# Patient Record
Sex: Male | Born: 1975 | Race: Black or African American | Hispanic: No | Marital: Single | Smoking: Never smoker
Health system: Southern US, Community
[De-identification: ages and names within clinical notes are randomized; demographics above are authoritative.]

## PROBLEM LIST (undated history)

## (undated) DIAGNOSIS — F419 Anxiety disorder, unspecified: Secondary | ICD-10-CM

## (undated) DIAGNOSIS — G4730 Sleep apnea, unspecified: Secondary | ICD-10-CM

## (undated) DIAGNOSIS — R410 Disorientation, unspecified: Secondary | ICD-10-CM

## (undated) DIAGNOSIS — F329 Major depressive disorder, single episode, unspecified: Secondary | ICD-10-CM

## (undated) DIAGNOSIS — R6250 Unspecified lack of expected normal physiological development in childhood: Secondary | ICD-10-CM

## (undated) DIAGNOSIS — H539 Unspecified visual disturbance: Secondary | ICD-10-CM

## (undated) DIAGNOSIS — E669 Obesity, unspecified: Secondary | ICD-10-CM

## (undated) DIAGNOSIS — F32A Depression, unspecified: Secondary | ICD-10-CM

## (undated) DIAGNOSIS — R569 Unspecified convulsions: Secondary | ICD-10-CM

## (undated) DIAGNOSIS — F79 Unspecified intellectual disabilities: Secondary | ICD-10-CM

## (undated) DIAGNOSIS — W19XXXA Unspecified fall, initial encounter: Secondary | ICD-10-CM

## (undated) DIAGNOSIS — G808 Other cerebral palsy: Secondary | ICD-10-CM

## (undated) DIAGNOSIS — R296 Repeated falls: Secondary | ICD-10-CM

## (undated) DIAGNOSIS — G40909 Epilepsy, unspecified, not intractable, without status epilepticus: Secondary | ICD-10-CM

## (undated) HISTORY — DX: Depression, unspecified: F32.A

## (undated) HISTORY — DX: Sleep apnea, unspecified: G47.30

## (undated) HISTORY — DX: Disorientation, unspecified: R41.0

## (undated) HISTORY — PX: COLONOSCOPY: SHX174

## (undated) HISTORY — DX: Unspecified lack of expected normal physiological development in childhood: R62.50

## (undated) HISTORY — DX: Anxiety disorder, unspecified: F41.9

## (undated) HISTORY — DX: Unspecified intellectual disabilities: F79

## (undated) HISTORY — DX: Other cerebral palsy: G80.8

## (undated) HISTORY — DX: Unspecified visual disturbance: H53.9

## (undated) HISTORY — DX: Obesity, unspecified: E66.9

## (undated) HISTORY — DX: Repeated falls: R29.6

## (undated) HISTORY — DX: Epilepsy, unspecified, not intractable, without status epilepticus: G40.909

## (undated) HISTORY — DX: Major depressive disorder, single episode, unspecified: F32.9

## (undated) HISTORY — DX: Unspecified convulsions: R56.9

## (undated) HISTORY — DX: Unspecified fall, initial encounter: W19.XXXA

---

## 2000-09-13 ENCOUNTER — Emergency Department (HOSPITAL_COMMUNITY): Admission: EM | Admit: 2000-09-13 | Discharge: 2000-09-13 | Payer: Self-pay | Admitting: Emergency Medicine

## 2000-09-13 ENCOUNTER — Encounter: Payer: Self-pay | Admitting: Emergency Medicine

## 2004-02-20 ENCOUNTER — Ambulatory Visit (HOSPITAL_COMMUNITY): Admission: RE | Admit: 2004-02-20 | Discharge: 2004-02-20 | Payer: Self-pay | Admitting: Dentistry

## 2005-02-11 ENCOUNTER — Encounter: Admission: RE | Admit: 2005-02-11 | Discharge: 2005-02-11 | Payer: Self-pay | Admitting: Internal Medicine

## 2005-02-17 ENCOUNTER — Ambulatory Visit (HOSPITAL_BASED_OUTPATIENT_CLINIC_OR_DEPARTMENT_OTHER): Admission: RE | Admit: 2005-02-17 | Discharge: 2005-02-17 | Payer: Self-pay | Admitting: Internal Medicine

## 2005-02-23 ENCOUNTER — Ambulatory Visit: Payer: Self-pay | Admitting: Internal Medicine

## 2005-07-15 ENCOUNTER — Ambulatory Visit: Payer: Self-pay | Admitting: Family Medicine

## 2005-08-02 ENCOUNTER — Emergency Department (HOSPITAL_COMMUNITY): Admission: EM | Admit: 2005-08-02 | Discharge: 2005-08-02 | Payer: Self-pay | Admitting: Emergency Medicine

## 2005-11-18 ENCOUNTER — Emergency Department (HOSPITAL_COMMUNITY): Admission: EM | Admit: 2005-11-18 | Discharge: 2005-11-18 | Payer: Self-pay | Admitting: Emergency Medicine

## 2007-12-21 ENCOUNTER — Ambulatory Visit: Payer: Self-pay | Admitting: Gastroenterology

## 2008-01-19 ENCOUNTER — Ambulatory Visit: Payer: Self-pay | Admitting: Gastroenterology

## 2008-01-19 DIAGNOSIS — K625 Hemorrhage of anus and rectum: Secondary | ICD-10-CM

## 2008-01-19 DIAGNOSIS — K5900 Constipation, unspecified: Secondary | ICD-10-CM | POA: Insufficient documentation

## 2008-01-20 ENCOUNTER — Telehealth: Payer: Self-pay | Admitting: Gastroenterology

## 2008-02-01 ENCOUNTER — Telehealth: Payer: Self-pay | Admitting: Gastroenterology

## 2008-03-06 ENCOUNTER — Ambulatory Visit: Payer: Self-pay | Admitting: Gastroenterology

## 2008-03-08 ENCOUNTER — Telehealth: Payer: Self-pay | Admitting: Gastroenterology

## 2008-03-10 ENCOUNTER — Telehealth (INDEPENDENT_AMBULATORY_CARE_PROVIDER_SITE_OTHER): Payer: Self-pay | Admitting: *Deleted

## 2008-03-13 ENCOUNTER — Ambulatory Visit: Payer: Self-pay | Admitting: Gastroenterology

## 2010-05-15 ENCOUNTER — Emergency Department (HOSPITAL_BASED_OUTPATIENT_CLINIC_OR_DEPARTMENT_OTHER)
Admission: EM | Admit: 2010-05-15 | Discharge: 2010-05-15 | Disposition: A | Discharge status: Home / Self Care | Payer: Medicare Other | Attending: Emergency Medicine | Admitting: Emergency Medicine

## 2010-05-15 ENCOUNTER — Emergency Department (INDEPENDENT_AMBULATORY_CARE_PROVIDER_SITE_OTHER): Payer: Medicare Other

## 2010-05-15 DIAGNOSIS — X58XXXA Exposure to other specified factors, initial encounter: Secondary | ICD-10-CM | POA: Insufficient documentation

## 2010-05-15 DIAGNOSIS — G809 Cerebral palsy, unspecified: Secondary | ICD-10-CM | POA: Insufficient documentation

## 2010-05-15 DIAGNOSIS — M79609 Pain in unspecified limb: Secondary | ICD-10-CM

## 2010-05-15 DIAGNOSIS — G4730 Sleep apnea, unspecified: Secondary | ICD-10-CM | POA: Insufficient documentation

## 2010-05-15 DIAGNOSIS — I10 Essential (primary) hypertension: Secondary | ICD-10-CM | POA: Insufficient documentation

## 2010-05-15 DIAGNOSIS — Z79899 Other long term (current) drug therapy: Secondary | ICD-10-CM | POA: Insufficient documentation

## 2010-08-23 NOTE — Op Note (Signed)
Mario Willis, Mario Willis              ACCOUNT NO.:  [id number]   MEDICAL RECORD NO.:  [id number]          PATIENT TYPE:  OIB   LOCATION:  2899                         FACILITY:  [facility]   PHYSICIAN:  Griffith Citron Mohorn, D.D.S.DATE OF BIRTH:  10-Jun-1975   DATE OF PROCEDURE:  02/20/2004  DATE OF DISCHARGE:                                 OPERATIVE REPORT   PREOPERATIVE DIAGNOSES:  1.  Impacted tooth #17.  2.  Nonfunctional and decayed teeth #1 and 16.   POSTOPERATIVE DIAGNOSES:  1.  Impacted tooth #17.  2.  Nonfunctional and decayed teeth #1 and 16.  3.  Excessive gingival tissue overlying tooth #15.   PROCEDURE:  1.  Extraction of partially impacted tooth #17.  2.  Surgical extraction of teeth #1 and 16.  3.  Gingivectomy adjacent to tooth #15.   SURGEON:  Griffith Citron Mohorn, D.D.S.   ANESTHESIA:  General anesthesia.   INDICATION FOR PROCEDURE:  The patient was evaluated in my office following  a referral from his dentist for removal of multiple third molars.  Following  clinical examination, he was found to have a partially impacted tooth #17  and nonfunctional teeth #1 and 16.  Due to the patient's history of seizures  and possible inability to cooperative preoperatively, it was elected to have  the procedure performed in the main operating room at [facility].   DESCRIPTION OF PROCEDURE:  The patient was brought to the operating room and  placed in a supine position.  Once general anesthesia was induced via  nasotracheal intubation, the patient was prepped and draped for a  maxillofacial procedure of this type.  Initially 7 mL of 2% lidocaine with  1:100,000 epinephrine was injected into the posterior left and right maxilla  as well as the left posterior mandible.  A preoperative exam revealed  excessive gingival tissue overlying tooth #15; therefore, it was elected at  that time to perform a gingivectomy.  Initially attention was directed  toward the extraction of tooth #17.   A 15 blade was utilized to create an  incision extending from the buccal mucosa to the distal facial of tooth #17.  An anterior releasing incision was created and a mucoperiosteal flap was  reflected, exposing the bone overlying a portion of the crown of tooth #17.  A rotary osteotome with copious amounts of saline irrigation was utilized to  remove the overlying bone and section tooth #17.  The tooth was sectioned  successfully and all parts were removed without complications.  The bone was  smoothed and the site was irrigated with normal saline, and the soft tissue  was reapproximated with 3-0 chromic suture.   Attention was then directed toward areas #16 and 15.  A 15 blade was  utilized to create a vertical releasing incision anterior to tooth #15 with  the incision continuing posteriorly around 15 and 16.  A full-thickness  mucoperiosteal flap was reflected.  The bone on the buccal surface of tooth  #15 and 16 was found to be excessively thick.  Rongeurs and a Fisher bur  with copious amounts of saline  irrigation were utilized to remove the  excessive bone.  The bone was removed down to the root on tooth #16.  This  tooth was mobilized and eventually removed without complications.  Next,  prior to reapproximation of the flap the excessive gingival tissue adjacent  to tooth #15 was then excised with the 15 blade.  It was scalloped to match  the adjacent gingival tissue on tooth #14.  At this point all the bone was  smoothed and the site was irrigated thoroughly with normal saline and the  soft tissue was reapproximated with 0 chromic suture.   Attention was then directed toward the removal of tooth #1.  Again, a  vertical releasing incision was made anterior to tooth #2 with the incision  continuing posteriorly around 2 and tooth #1.  A full-thickness  mucoperiosteal flap was reflected and a Fisher bur was utilized to remove  the bone buccal to the roots of tooth #1.  The bone was  removed down to  where the roots were completely exposed on the buccal surface.  The tooth  was then mobilized.  The gingival tissue was also loosened from the palatal  surface.  The tooth was then removed.  During removal of this tooth, a 1 cm  tear in the palatal gingival tissue was noted.  The bone was smoothed and  the area and the site was irrigated with normal saline.  The palatal  gingival tear was repaired with 3-0 chromic suture and the remaining  gingival tissue was sutured close utilizing 3-0 chromic suture.  The oral  cavity was thoroughly irrigated and suctioned dry and examined for any bone  or tooth fragments.  The oropharyngeal throat pack was then removed and the  patient was allowed to awaken and transferred to the PACU in stable and  satisfactory condition.       DJM/MEDQ  D:  02/20/2004  T:  02/20/2004  Job:  [id number]

## 2010-08-23 NOTE — Procedures (Signed)
NAMEAMBROSIO, Mario Willis              ACCOUNT NO.:  [id number]   MEDICAL RECORD NO.:  [id number]          PATIENT TYPE:  OUT   LOCATION:  SLEEP CENTER                 FACILITY:  [facility]   PHYSICIAN:  Clinton D. Maple Hudson, M.D. DATE OF BIRTH:  1975-05-03   DATE OF STUDY:  02/17/2005                              NOCTURNAL POLYSOMNOGRAM   REFERRING PHYSICIAN:  Dr. Della Goo.   DATE OF STUDY:  February 17, 2005.   INDICATION FOR STUDY:  Insomnia with sleep apnea.   EPWORTH SLEEPINESS SCORE:  5/24.   BMI:  36.   WEIGHT:  273 pounds.   Complaints of restless screaming and kicking in sleep and enuresis.   SLEEP ARCHITECTURE:  Total sleep time 441 minutes with sleep efficiency 99%.  Stage I was absent, stage II 82%, stages III and IV 11%, REM 7% of total  sleep time. Sleep latency 2 minutes, REM latency 375 minutes, awake after  sleep onset 1 minute, arousal index increased at 44. Bedtime medications  were not reported.   RESPIRATORY DATA:  Split study protocol. Apnea/hypopnea index (AHI, RDI)  86.6 obstructive events per hour indicating very severe obstructive sleep  apnea/hypopnea syndrome before C-PAP. This included 198 obstructive apneas  before C-PAP. Most sleep and therefore most events were while supine. REM  AHI of 3.9. C-PAP was titrated to 25 CWP. AHI at this pressure was 1.2 per  hour. A bilevel machine would be required for this pressure. Significant  obstructive events were not reported higher than 23 CWP with the higher  pressure provided final snoring control. A large full-face ultra mirage mask  was used with heated humidifier and it was reported he tolerated C-PAP well.   OXYGEN DATA:  Very loud snoring with oxygen desaturation to a nadir of 70%  before C-PAP. After C-PAP control, saturation held 94-98% on room air.   CARDIAC DATA:  Sinus bradycardia and normal sinus rhythm, heart rate 57-68  per minute.   MOVEMENT/PARASOMNIA:  Anuresis. Occasional leg jerk. No  other significant  behavioral disturbance was reported the study night.   IMPRESSION/RECOMMENDATIONS:  1.  Severe obstructive sleep apnea / hypopnea syndrome, AHI 86.6 per hour      with very loud snoring and oxygen desaturation to 70%.  2.  Successful C-PAP titration to a relatively high pressure of 25 CWP. This      would require a bilevel machine. A large full-face ultra mirage mask was      used with heated humidifier and reportedly well tolerated.  3.  Enuresis.      Clinton D. Maple Hudson, M.D.  Diplomate, Biomedical engineer of Sleep Medicine  Electronically Signed     CDY/MEDQ  D:  02/23/2005 10:27:56  T:  02/23/2005 22:58:42  Job:  [id number]

## 2011-12-12 IMAGING — CR DG HAND COMPLETE 3+V*R*
3 series · 3 of 3 positions shown · non-contrast
Comparison: None

CLINICAL DATA: Fell.  Right hand pain.

RIGHT HAND - COMPLETE 3+ VIEW

[x hand pa right]
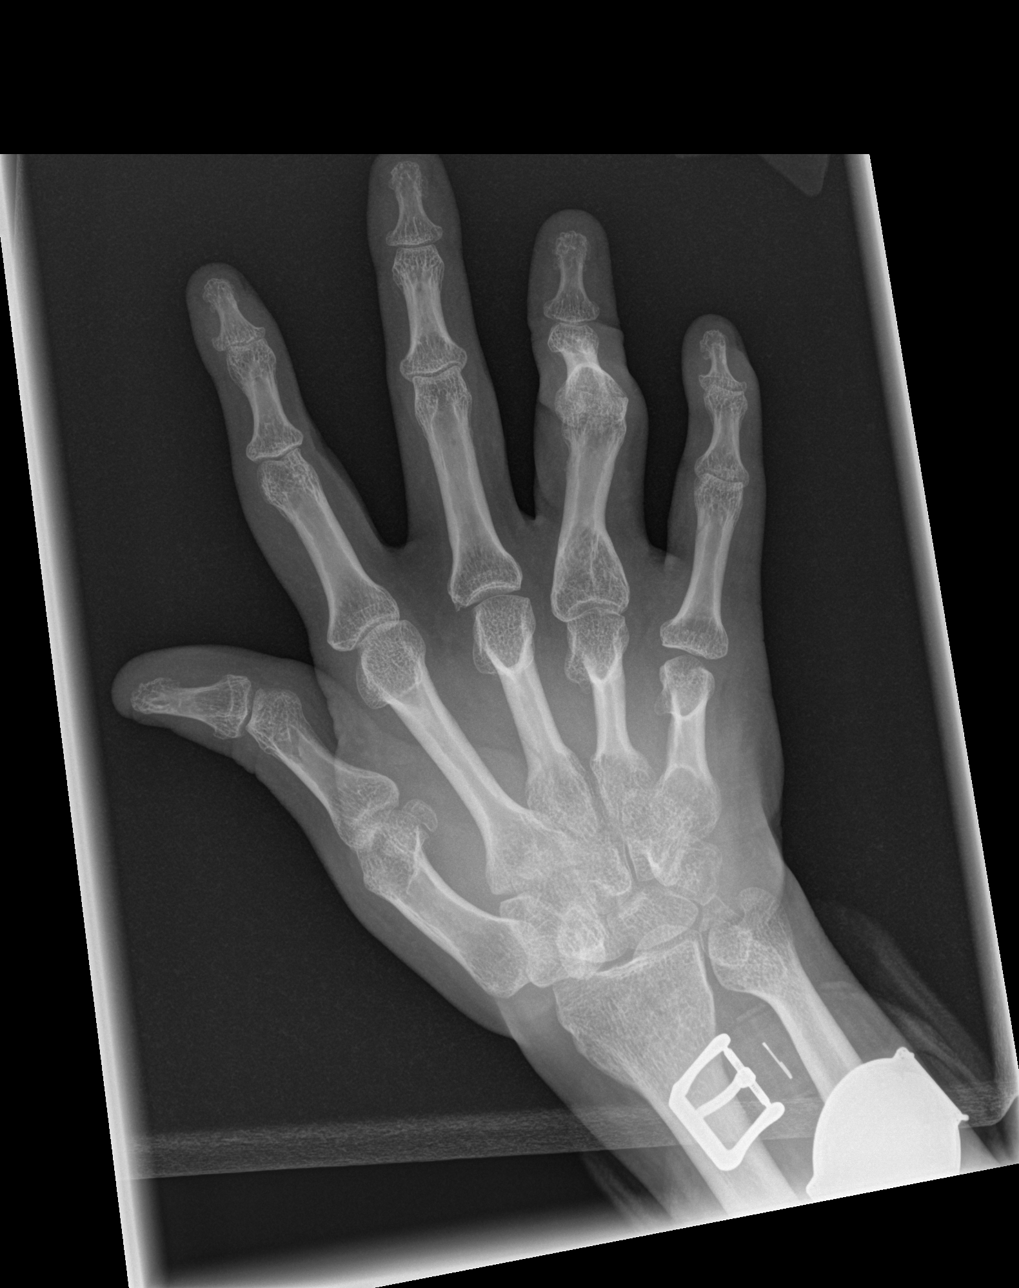

[x hand oblique right]
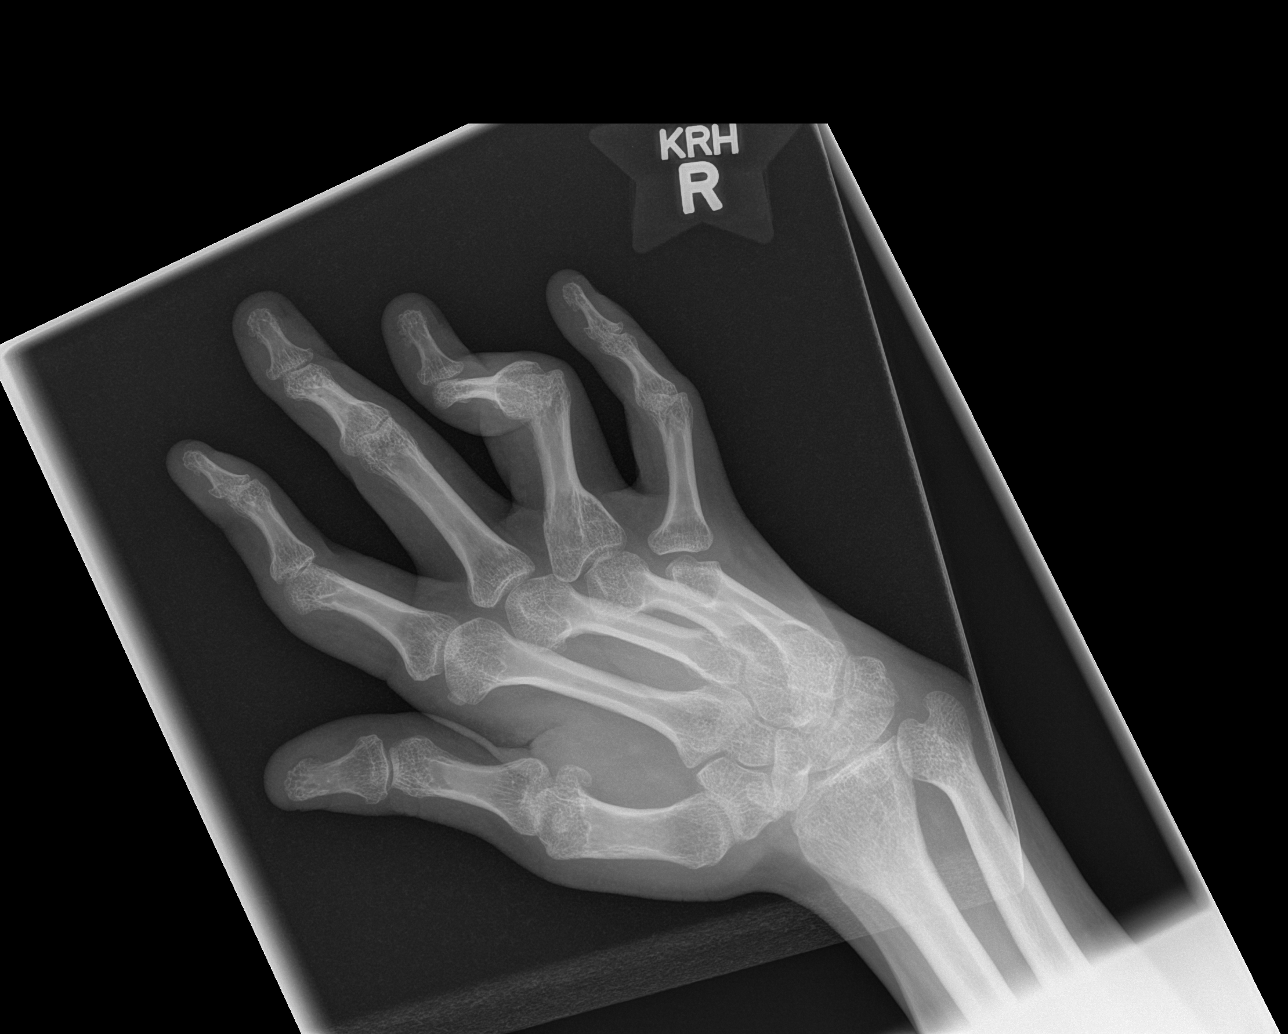

[x hand lat right]
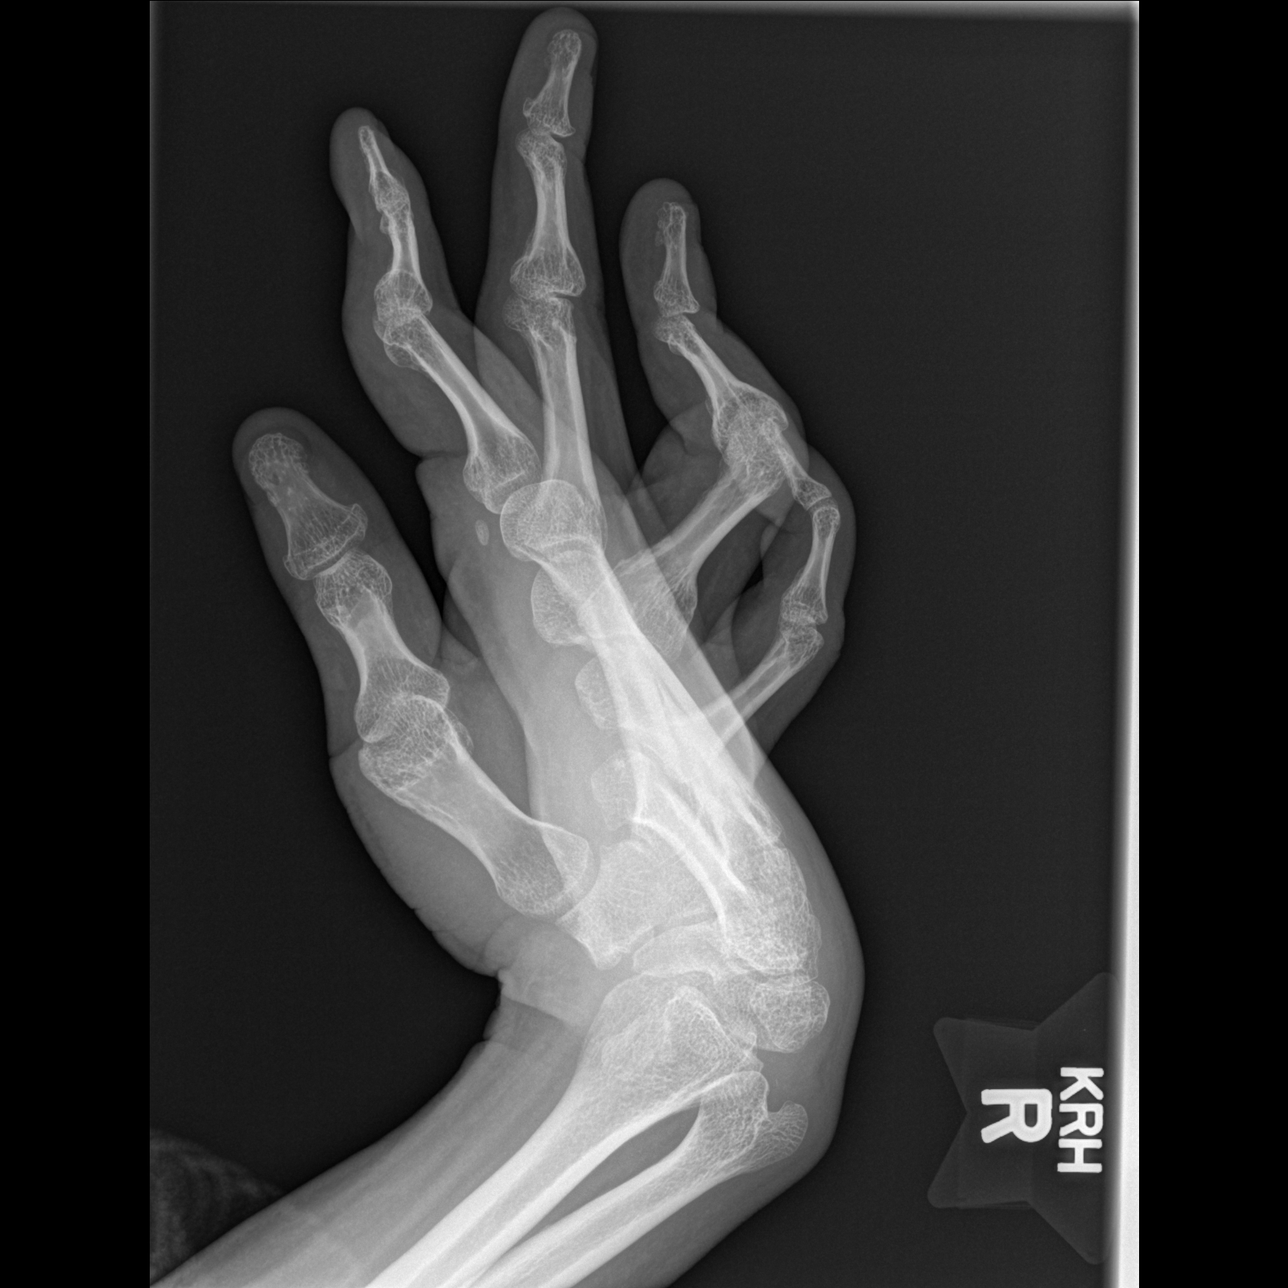

[3 of 3 positions shown; findings below may reference images not displayed]

FINDINGS: Moderate deformity of the hand with mild degenerative
changes.  No definite acute fracture.
IMPRESSION: No acute bony findings.

## 2012-07-22 ENCOUNTER — Telehealth: Payer: Self-pay

## 2012-07-22 NOTE — Telephone Encounter (Signed)
Unknown Male called and left a message saying the Lyrica 75mg  needs authorization.  Records show the patient has refills on file at the pharmacy.  A prior Berkley Harvey is needed through ins.  I will initiate PA.  Will have to wait for ins response.

## 2012-11-23 ENCOUNTER — Ambulatory Visit: Payer: Self-pay | Admitting: Neurology

## 2012-12-10 ENCOUNTER — Other Ambulatory Visit: Payer: Self-pay | Admitting: Neurology

## 2012-12-10 NOTE — Telephone Encounter (Signed)
Rx signed and faxed.

## 2012-12-17 ENCOUNTER — Ambulatory Visit: Payer: Self-pay | Admitting: Neurology

## 2013-02-01 ENCOUNTER — Encounter: Payer: Self-pay | Admitting: Neurology

## 2013-02-01 ENCOUNTER — Ambulatory Visit (INDEPENDENT_AMBULATORY_CARE_PROVIDER_SITE_OTHER): Payer: Medicare Other | Admitting: Neurology

## 2013-02-01 VITALS — BP 121/69 | HR 71 | Resp 18 | Ht 75.0 in | Wt 220.0 lb

## 2013-02-01 DIAGNOSIS — G4733 Obstructive sleep apnea (adult) (pediatric): Secondary | ICD-10-CM

## 2013-02-01 DIAGNOSIS — G4730 Sleep apnea, unspecified: Secondary | ICD-10-CM

## 2013-02-01 DIAGNOSIS — R6250 Unspecified lack of expected normal physiological development in childhood: Secondary | ICD-10-CM

## 2013-02-01 DIAGNOSIS — G808 Other cerebral palsy: Secondary | ICD-10-CM

## 2013-02-01 DIAGNOSIS — N3944 Nocturnal enuresis: Secondary | ICD-10-CM

## 2013-02-01 DIAGNOSIS — G40909 Epilepsy, unspecified, not intractable, without status epilepticus: Secondary | ICD-10-CM

## 2013-02-01 DIAGNOSIS — G40802 Other epilepsy, not intractable, without status epilepticus: Secondary | ICD-10-CM

## 2013-02-01 HISTORY — DX: Sleep apnea, unspecified: G47.30

## 2013-02-01 HISTORY — DX: Other cerebral palsy: G80.8

## 2013-02-01 HISTORY — DX: Unspecified lack of expected normal physiological development in childhood: R62.50

## 2013-02-01 HISTORY — DX: Epilepsy, unspecified, not intractable, without status epilepticus: G40.909

## 2013-02-01 MED ORDER — DILTIAZEM HCL ER 240 MG PO CP24: 240.0000 mg | ORAL_CAPSULE | Freq: Every day | ORAL | Status: AC

## 2013-02-01 NOTE — Patient Instructions (Signed)
Seizures You had a seizure. About 2% of the population will have a seizure problem during their lifetime. Sometimes the cause for the seizure is not known. Seizures are usually associated with one of these problems:  Epilepsy.   Not taking your seizure medicine.   Alcohol and drug abuse.   Head injury, strokes, tumors, and brain surgery.   High fever and infections.   Low blood sugar.  Evaluating a new seizure disorder may require having a brain scan or a brain wave test called an EEG. If you have been given a seizure medicine, it is very important that you take it as prescribed. Not taking these medicines as directed is the most common cause of seizures. Blood tests are often used to be sure you are taking the proper dose.  Seizures cause many different symptoms, from convulsions to brief blackouts. Do not ride a bike, drive a car, go swimming, climb in high or dangerous places such as ladders or roofs, or operate any dangerous equipment until you have your doctor's permission. If you hold a driver's license, state law may require that a report be made to the motor vehicles department. You should wear an emergency medical identification bracelet with information about your seizures. If you have any warning that a seizure may occur, lie down in a safe place to protect yourself. Teach your family and friends what to do if you have any further seizures. They should stay calm and try to keep you from falling on hard or sharp objects. It is best not to try to restrain a seizing person or to force anything into his or her mouth. Do not try to open clenched jaws. When the seizure is over, the person should be rolled on their side to help drain any vomit or secretions from the mouth. After a seizure, a person may be confused or drowsy for several minutes. An ambulance should be called if the seizure lasted more than 5 minutes or if confusion remains for more than 30 minutes. Call your caregiver or the  emergency department for further instructions. Do not drive until cleared by your caregiver or neurologist! Document Released: 05/01/2004 Document Revised: 12/04/2010 Document Reviewed: 03/24/2005 Exit[employer] Patient Information 2012 [employer]. 

## 2013-02-01 NOTE — Progress Notes (Signed)
[facility]  Provider:  Melvyn Novas, M D  Referring Provider: No ref. provider found Primary Care Physician:  Mayra Neer, MD  Chief Complaint  Patient presents with  . CPAP F/U Visit   Seizure disorder, MRDD and Sleep apnea.      HPI:  Mario Willis is a 37 y.o. male  Is seen here as a referral/ revisit  from Dr. Lovell Sheehan.   Is a long-time established patient in our practice or as a child followed Dr. them thickening for seizure disorder. 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 He has right-sided spastic hemiparesis he has seizures arising from the left hemisphere and has a history of mental retardation or delay. No originally started to see Davon Abdelaziz. date he was morbidly obese. In 2006 is the study was performed revealing an AHI of 86.6 at [facility] he was split to CPAP and that at 25 cm water with a fullface mask. There were no adjustments made over the uterus and he had not seen anybody for asleep and medicine evaluation. In 2012 his CPAP was program and his caretaker mentions as he also had lost 80 pounds and it was well-liked likely that he didn't need the same high pressures anymore. He was re\re titrated and doing the sleep study with EEG showed left hemispheric epileptiform discharges.  He was titrated to 10 cm water with 2 cm EPR. He had no active seizures history since at least 2011, and his sleepiness score measured by Epworth questionnaire had been soaking of 21 and his last visit dated 11-12-11. Today he brought his machine with him and the download shows that the patient has a residual AHI of 4.4 has been using the machine for 60 over 60 days. His average time in therapy at night is 10 hours and 43 minutes. The machine is a VPAP bottle showing a medium inspiratory pressure of 10.7 cm and  expiratory pressure of 6.7 cm water.  He does have high air leak however. His download on 11-17-2011 the last available  prior to today showed an AHI of 5.8 average daily time and therapy of 9 hours 55 minutes.   similar IPAP and EPAP pressures. Based  on these data,  there is no change necessary  Rylin also endorsed the Epworth Sleepiness Scale at 7 points only the lowest I have seen him doing and her fatigue severity scale at 33 points.       Review of Systems: Out of a complete 14 system review, the patient complains of only the following symptoms, and all other reviewed systems are negative.  Epworth  7 points.   History   Social History  . Marital Status: Single    Spouse Name: N/A    Number of Children: N/A  . Years of Education: N/A   Occupational History  . Not on file.   Social History Main Topics  . Smoking status: Never Smoker   . Smokeless tobacco: Never Used  . Alcohol Use: No  . Drug Use: No  . Sexual Activity: No   Other Topics Concern  . Not on file   Social History Narrative  . No narrative on file    Family History  Problem Relation Age of Onset  . Family history unknown: Yes    Past Medical History  Diagnosis Date  . Sleep apnea   . Mental disability     childhood  . Depression   . Anxiety   .  Obesity   . Seizures   . Developmental delay   . Vision abnormalities     Past Surgical History  Procedure Laterality Date  . Colonoscopy      Current Outpatient Prescriptions  Medication Sig Dispense Refill  . clonazePAM (KLONOPIN) 1 MG tablet Take 1 mg by mouth 3 (three) times daily as needed for anxiety.      Marland Kitchen desmopressin (DDAVP) 0.2 MG tablet Take 0.2 mg by mouth daily. At night for insipidus.      . divalproex (DEPAKOTE) 500 MG DR tablet Take 500 mg by mouth 3 (three) times daily. Three at night po      . LYRICA 75 MG capsule TAKE ONE CAPSULE BY MOUTH AT BEDTIME  90 capsule  0  . QUEtiapine (SEROQUEL) 300 MG tablet Take 300 mg by mouth at bedtime. 2 at 8 Pm po.       No current facility-administered medications for this visit.    Allergies as of  02/01/2013 - Review Complete 02/01/2013  Allergen Reaction Noted  . Gabapentin      Vitals: BP 121/69  Pulse 71  Resp 18  Ht 6\' 3"  (1.905 m)  Wt 220 lb (99.791 kg)  BMI 27.5 kg/m2 Last Weight:  Wt Readings from Last 1 Encounters:  02/01/13 220 lb (99.791 kg)   Last Height:   Ht Readings from Last 1 Encounters:  02/01/13 6\' 3"  (1.905 m)    Physical exam:  General: The patient is awake, alert and appears not in acute distress. The patient is well groomed. Head: Normocephalic, atraumatic. Neck is supple. Mallampati 3, neck circumference: 16 inches  , facial  Deformity based on hemiparesis of right body and left  face.  Cardiovascular:  Regular rate and rhythm, without  murmurs or carotid bruit, and without distended neck veins. Respiratory: Lungs are clear to auscultation. Skin:  Without evidence of edema, or rash Trunk: BMI is elevated .  Neurologic exam : The patient is awake and alert, oriented to place and time.  Limited attention span & concentration ability.  Speech is non-fluent without  dysarthria, dysphonia or aphasia. Mood and affect are appropriate.  Cranial nerves: Pupils are equal and briskly reactive to light.  Extraocular movements  in vertical and horizontal planes intact and with nystagmus 4-5 beats to the right.  . Visual fields by finger perimetry are intact on the right , the left seems to have peripheral field loss.  Hearing to finger rub intact.   Facial sensation intact to fine touch.  Facial motor strength is asymmetric , tongue and uvula  Midline .    Motor exam:  Hemiparesis with spasticity in the right body.   Sensory:  Fine touch, pinprick and vibration were tested in all extremities  Proprioception is tested in the upper extremities only. This was intact. Gait is impaired by hemiparesis. No recent falls, he has a foot drop on the right. Babinski on the right positive, DTR elevated on the right.     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 Behavior difficulties controlled on seroquel.Klonopin and  Lyrica.  2) seizures , controlled.  3) OSA controlled on auto PAP IPAP and EPAP are variable. continue use - without settings. Marland Kitchen

## 2013-02-04 ENCOUNTER — Encounter: Payer: Self-pay | Admitting: Neurology

## 2013-02-25 ENCOUNTER — Other Ambulatory Visit: Payer: Self-pay | Admitting: Neurology

## 2013-02-25 NOTE — Telephone Encounter (Signed)
Patient's prescription was faxed over to [facility] at [phone].

## 2013-08-29 ENCOUNTER — Other Ambulatory Visit: Payer: Self-pay | Admitting: Neurology

## 2013-08-30 ENCOUNTER — Other Ambulatory Visit: Payer: Self-pay

## 2013-08-30 MED ORDER — PREGABALIN 75 MG PO CAPS: ORAL_CAPSULE | ORAL | Status: DC

## 2013-08-31 NOTE — Telephone Encounter (Signed)
Rx has been faxed.

## 2014-01-31 ENCOUNTER — Encounter: Payer: Self-pay | Admitting: Neurology

## 2014-01-31 ENCOUNTER — Ambulatory Visit (INDEPENDENT_AMBULATORY_CARE_PROVIDER_SITE_OTHER): Payer: Medicare Other | Admitting: Neurology

## 2014-01-31 VITALS — BP 138/82 | HR 75 | Temp 98.7°F | Resp 12 | Ht 73.0 in | Wt 219.0 lb

## 2014-01-31 DIAGNOSIS — Z9989 Dependence on other enabling machines and devices: Secondary | ICD-10-CM

## 2014-01-31 DIAGNOSIS — G40909 Epilepsy, unspecified, not intractable, without status epilepticus: Secondary | ICD-10-CM

## 2014-01-31 DIAGNOSIS — G801 Spastic diplegic cerebral palsy: Secondary | ICD-10-CM

## 2014-01-31 DIAGNOSIS — G4733 Obstructive sleep apnea (adult) (pediatric): Secondary | ICD-10-CM

## 2014-01-31 DIAGNOSIS — F89 Unspecified disorder of psychological development: Secondary | ICD-10-CM

## 2014-01-31 MED ORDER — PREGABALIN 75 MG PO CAPS: ORAL_CAPSULE | ORAL | Status: DC

## 2014-01-31 NOTE — Progress Notes (Signed)
[facility]  Provider:  Melvyn Novasarmen  Janene Yousuf, M D  Referring Provider: Mayra NeerJenkins, Sharrah, MD Primary Care Physician:  Mayra NeerJENKINS,SHARRAH, MD  Chief Complaint  Patient presents with  . RV sleep    Rm 10, care provider   Seizure disorder, MRDD and Sleep apnea.      HPI:  Mario Willis is a 38 y.o. male  Is seen here as a referral/ revisit  from Dr. Lovell SheehanJenkins.   Is a long-time established patient in our practice or as a child followed Dr. Sharene SkeansHickling for seizure disorder.  Mario Willis is by default left-handed as  cerebral palsy affected his right side.  He has right-sided spastic hemiparesis he has seizures arising from the left hemisphere and has a history of mental retardation.  When I originally started to see Mario Willis,  he was morbidly obese.   In 2006 is the study was performed revealing an AHI of 86.6 at [facility] , where he was split to CPAP and that at 25 cm water with a fullface mask.  There were no adjustments made over the uterus and he had not seen anybody for asleep and medicine evaluation.   In 2012 his CPAP was programmed ,he also had lost 80 pounds and it was  likely that he didn't need the same high pressures anymore.  He was re\re titrated and during the sleep study his  EEG showed left hemispheric epileptiform discharges. He was titrated to 10 cm water with 2 cm EPR. He had no active seizures history since at least 2011, and his sleepiness score measured by Epworth questionnaire had been soaking of 21 and his last visit dated 11-12-11. Today he brought his machine with him and the download shows that the patient has a residual AHI of 4.4 has been using the machine for 60 over 60 days. His average time in therapy at night is 10 hours and 43 minutes. The machine is a VPAP bottle showing a medium inspiratory pressure of 10.7 cm and  expiratory pressure of 6.7 cm water.  He does have high air leak however. His download on 11-17-2011 the last available prior to  today showed an AHI of 5.8 average daily time and therapy of 9 hours 55 minutes.  similar IPAP and EPAP pressures. Based  on these data,  there is no change necessary  01-31-14;  The patient apears unchanged to last years visit , and his caretaker filled his Epworth score at 16 points. He has some confusion about date and day of the week. This seems new to his caretaker.  He is scheduled [facility] a treatment of the contracted ankles and wrist of the right hand.  He needs refills for seizure medication , Lyrica at 75 mg daily,  and a compliance download  For this yearly Rv is due today. CPAP today, 88% compliance over the last 90 days.  AHI is 6.3, higher than desired.  His user time is  8 hours and 39 minutes. Very high air leak. New mask needed.      Review of Systems: Out of a complete 14 system review, the patient complains of only the following symptoms, and all other reviewed systems are negative.  Epworth  16 points.  Seizures, last over 12 month ago.   History   Social History  . Marital Status: Single    Spouse Name: N/A    Number of Children: N/A  . Years of Education: N/A   Occupational  History  . Not on file.   Social History Main Topics  . Smoking status: Never Smoker   . Smokeless tobacco: Never Used  . Alcohol Use: No  . Drug Use: No  . Sexual Activity: No   Other Topics Concern  . Not on file   Social History Narrative   Left handed, NO caffeine, [employer].  Works at a shop (skills).  (non pay).     History reviewed. No pertinent family history.  Past Medical History  Diagnosis Date  . Sleep apnea   . Mental disability     childhood  . Depression   . Anxiety   . Obesity   . Seizures   . Developmental delay   . Vision abnormalities   . Development delay 02/01/2013  . Cerebral palsy, hemiplegic 02/01/2013  . Sleep apnea with use of continuous positive airway pressure (CPAP) 02/01/2013    Severe apnea  with asv , IPAP and EPAP  Not set, auto function.   Marland Kitchen. Epileptic seizures 02/01/2013  . Falls     increased falls since 7-8 mo ago  . Confusion     since 7-8 mo ago.    Past Surgical History  Procedure Laterality Date  . Colonoscopy      Current Outpatient Prescriptions  Medication Sig Dispense Refill  . clonazePAM (KLONOPIN) 1 MG tablet Take 1 mg by mouth 3 (three) times daily as needed for anxiety.      Marland Kitchen. desmopressin (DDAVP) 0.2 MG tablet Take 0.2 mg by mouth daily. At night for insipidus.      Marland Kitchen. diltiazem (DILACOR XR) 240 MG 24 hr capsule Take 1 capsule (240 mg total) by mouth daily.  30 capsule  11  . divalproex (DEPAKOTE) 500 MG DR tablet Take 500 mg by mouth 3 (three) times daily. Three at night po      . docusate sodium (STOOL SOFTENER) 100 MG capsule 60 mg 2 (two) times daily. 1 pill 2 x a day      . FLUoxetine (PROZAC) 20 MG capsule Take 20 mg by mouth at bedtime.       . montelukast (SINGULAIR) 10 MG tablet Take 10 mg by mouth every morning. 1 pill in the am      . polyethylene glycol (MIRALAX / GLYCOLAX) packet Take 17 g by mouth daily as needed.      . pregabalin (LYRICA) 75 MG capsule TAKE ONE CAPSULE BY MOUTH AT BEDTIME  90 capsule  1  . QUEtiapine (SEROQUEL) 300 MG tablet Take 300 mg by mouth at bedtime. 2 at 8 Pm po.       No current facility-administered medications for this visit.    Allergies as of 01/31/2014 - Review Complete 01/31/2014  Allergen Reaction Noted  . Gabapentin      Vitals: BP 138/82  Pulse 75  Temp(Src) 98.7 F (37.1 C) (Oral)  Resp 12  Ht 6\' 1"  (1.854 m)  Wt 219 lb (99.338 kg)  BMI 28.90 kg/m2 Last Weight:  Wt Readings from Last 1 Encounters:  01/31/14 219 lb (99.338 kg)   Last Height:   Ht Readings from Last 1 Encounters:  01/31/14 6\' 1"  (1.854 m)    Physical exam:  General: The patient is awake, alert and appears not in acute distress. The patient is well groomed. Head: Normocephalic, atraumatic. Neck is supple. Mallampati  3, neck circumference: 16 inches  , facial  Deformity based on hemiparesis of right body and left  face.  Cardiovascular:  Regular rate and rhythm, without  murmurs or carotid bruit, and without distended neck veins. Respiratory: Lungs are clear to auscultation. Skin:  Without evidence of edema, or rash Trunk: BMI is elevated .  Neurologic exam : The patient is awake and alert, oriented to place and time.  Limited attention span & concentration ability.  Speech is non-fluent without  dysarthria, dysphonia or aphasia. Mood and affect are appropriate.  Cranial nerves: Pupils are equal and briskly reactive to light.  Extraocular movements  in vertical and horizontal planes intact and with nystagmus 4-5 beats to the right.  . Visual fields by finger perimetry are intact on the right , the left seems to have peripheral field loss.  Hearing to finger rub intact.   Facial sensation intact to fine touch.  Facial motor strength is asymmetric , tongue and uvula  Midline .    Motor exam:  Hemiparesis with spasticity in the right body.   Sensory:  Fine touch, pinprick and vibration were tested in all extremities  Proprioception is tested in the upper extremities only. This was intact.   Gait is impaired by hemiparesis.  No recent falls, he has a foot drop on the right and inverted his foot, contracture?.  Babinski on the right up going,  DTR brisk with spasticity on the right.     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  Behavior difficulties controlled on seroquel. Remains on Klonopin and Lyrica.  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  2) Seizures , controlled. Refilled Lyrica 75 mg daily.  3) OSA controlled on CPAP variable from 5 though 20 cm water . continue use - without settings. Covers FFM.  Still EDS.

## 2014-09-01 ENCOUNTER — Encounter: Payer: Self-pay | Admitting: Gastroenterology

## 2014-09-12 ENCOUNTER — Other Ambulatory Visit: Payer: Self-pay | Admitting: Neurology

## 2014-09-13 NOTE — Telephone Encounter (Signed)
Rx has been signed and faxed  

## 2015-01-01 ENCOUNTER — Ambulatory Visit: Payer: Medicare Other | Admitting: Neurology

## 2015-02-01 ENCOUNTER — Ambulatory Visit (INDEPENDENT_AMBULATORY_CARE_PROVIDER_SITE_OTHER): Payer: Medicare Other | Admitting: Adult Health

## 2015-02-01 ENCOUNTER — Encounter: Payer: Self-pay | Admitting: Adult Health

## 2015-02-01 VITALS — BP 125/78 | HR 82 | Ht 73.0 in | Wt 228.0 lb

## 2015-02-01 DIAGNOSIS — G4733 Obstructive sleep apnea (adult) (pediatric): Secondary | ICD-10-CM | POA: Diagnosis not present

## 2015-02-01 DIAGNOSIS — Z9989 Dependence on other enabling machines and devices: Principal | ICD-10-CM

## 2015-02-01 DIAGNOSIS — G40909 Epilepsy, unspecified, not intractable, without status epilepticus: Secondary | ICD-10-CM

## 2015-02-01 DIAGNOSIS — G801 Spastic diplegic cerebral palsy: Secondary | ICD-10-CM

## 2015-02-01 MED ORDER — PREGABALIN 75 MG PO CAPS: 75.0000 mg | ORAL_CAPSULE | Freq: Every day | ORAL | Status: DC

## 2015-02-01 NOTE — Progress Notes (Signed)
PATIENT: Mario Willis DOB: 12-09-1975  REASON FOR VISIT: follow up- obstructive sleep apnea on CPAP, seizures, cerebral palsy HISTORY FROM: patient  HISTORY OF PRESENT ILLNESS: Mr. Mario Willis is a 39 year old male with a history of obstructive sleep apnea on CPAP, seizures and cerebral palsy. He returns today for follow-up. The patient CPAP download indicates that he uses his machine 89 out of 90 days for compliance of 93%. On average he uses his machine 10 hours and 4 minutes. He uses machine greater than 4 hours 82 out of 90 days for compliance of 91%. His maximum inspiratory pressure is 20 cm of water and minimum expiratory pressure is 5 cm water with pressure support of 4 cm of water. The patient's residual AHI is 7. The patient does have a significant leak at 75.7 L/m in the 95th percentile. The patient's caregiver reports that typically during the night the mask will slide off his face. He also states that occasionally the patient will get up to use the restroom and will not put the mask back on. The patient's Epworth sleepiness score is 14 and fatigue severity score is 57.The patients seizures have been well controlled on Lyrica 75 milligrams daily. He denies having any seizure events in the last year. The patient was recently switched from Depakote to Tegretol. The patient was on this medication for his behavior. He had to be taken off of Depakote due to increased ammonia levels per the caregiver. He returns today for an evaluation.  HISTORY (Dohmeier): Mario AllanMichael E Mikkelson is a 39 y.o. male Is seen here as a referral/ revisit from Dr. Lovell SheehanJenkins.  Is a long-time established patient in our practice or as a child followed Dr. Sharene SkeansHickling for seizure disorder. Mario Willis is by default left-handed as cerebral palsy affected his right side. He has right-sided spastic hemiparesis he has seizures arising from the left hemisphere and has a history of mental retardation. When I originally started to see  Mario Willis, he was morbidly obese.  In 2006 is the study was performed revealing an AHI of 86.6 at [facility] , where he was split to CPAP and that at 25 cm water with a fullface mask. There were no adjustments made over the uterus and he had not seen anybody for asleep and medicine evaluation.   In 2012 his CPAP was programmed ,he also had lost 80 pounds and it was likely that he didn't need the same high pressures anymore.  He was re\re titrated and during the sleep study his EEG showed left hemispheric epileptiform discharges. He was titrated to 10 cm water with 2 cm EPR. He had no active seizures history since at least 2011, and his sleepiness score measured by Epworth questionnaire had been soaking of 21 and his last visit dated 11-12-11. Today he brought his machine with him and the download shows that the patient has a residual AHI of 4.4 has been using the machine for 60 over 60 days. His average time in therapy at night is 10 hours and 43 minutes. The machine is a VPAP bottle showing a medium inspiratory pressure of 10.7 cm and expiratory pressure of 6.7 cm water.  He does have high air leak however. His download on 11-17-2011 the last available prior to today showed an AHI of 5.8 average daily time and therapy of 9 hours 55 minutes. similar IPAP and EPAP pressures. Based on these data, there is no change necessary  01-31-14;  The patient apears unchanged to last years visit ,  and his caretaker filled his Epworth score at 16 points. He has some confusion about date and day of the week. This seems new to his caretaker.  He is scheduled to see Dr Jacqulyn [facility] at [facility] for a treatment of the contracted ankles and wrist of the right hand.  He needs refills for seizure medication , Lyrica at 75 mg daily,  and a compliance download For this yearly Rv is due today. CPAP today, 88% compliance over the last 90 days.  AHI is 6.3, higher than desired.  His user time is 8 hours  and 39 minutes. Very high air leak. New mask needed.     REVIEW OF SYSTEMS: Out of a complete 14 system review of symptoms, the patient complains only of the following symptoms, and all other reviewed systems are negative.  Agitation, behavior problem, confusion, anxious  ALLERGIES: Allergies  Allergen Reactions  . Gabapentin     HOME MEDICATIONS: Outpatient Prescriptions Prior to Visit  Medication Sig Dispense Refill  . clonazePAM (KLONOPIN) 1 MG tablet Take 1 mg by mouth 3 (three) times daily as needed for anxiety.    Marland Kitchen desmopressin (DDAVP) 0.2 MG tablet Take 0.2 mg by mouth daily. At night for insipidus.    Marland Kitchen diltiazem (DILACOR XR) 240 MG 24 hr capsule Take 1 capsule (240 mg total) by mouth daily. 30 capsule 11  . divalproex (DEPAKOTE) 500 MG DR tablet Take 500 mg by mouth 3 (three) times daily. Three at night po    . docusate sodium (STOOL SOFTENER) 100 MG capsule 60 mg 2 (two) times daily. 1 pill 2 x a day    . FLUoxetine (PROZAC) 20 MG capsule Take 20 mg by mouth at bedtime.     Marland Kitchen LYRICA 75 MG capsule TAKE ONE CAPSULE BY MOUTH AT BEDTIME 90 capsule 1  . montelukast (SINGULAIR) 10 MG tablet Take 10 mg by mouth every morning. 1 pill in the am    . polyethylene glycol (MIRALAX / GLYCOLAX) packet Take 17 g by mouth daily as needed.    Marland Kitchen QUEtiapine (SEROQUEL) 300 MG tablet Take 300 mg by mouth at bedtime. 2 at 8 Pm po.     No facility-administered medications prior to visit.    PAST MEDICAL HISTORY: Past Medical History  Diagnosis Date  . Sleep apnea   . Mental disability     childhood  . Depression   . Anxiety   . Obesity   . Seizures   . Developmental delay   . Vision abnormalities   . Development delay 02/01/2013  . Cerebral palsy, hemiplegic 02/01/2013  . Sleep apnea with use of continuous positive airway pressure (CPAP) 02/01/2013    Severe apnea with asv , IPAP and EPAP  Not set, auto function.   Marland Kitchen Epileptic seizures 02/01/2013  . Falls     increased falls  since 7-8 mo ago  . Confusion     since 7-8 mo ago.    PAST SURGICAL HISTORY: Past Surgical History  Procedure Laterality Date  . Colonoscopy      FAMILY HISTORY: No family history on file.  SOCIAL HISTORY: Social History   Social History  . Marital Status: Single    Spouse Name: N/A  . Number of Children: N/A  . Years of Education: N/A   Occupational History  . Not on file.   Social History Main Topics  . Smoking status: Never Smoker   . Smokeless tobacco: Never Used  . Alcohol Use: No  .  Drug Use: No  . Sexual Activity: No   Other Topics Concern  . Not on file   Social History Narrative   Left handed, NO caffeine, [employer].  Works at a shop (skills).  (non pay).       PHYSICAL EXAM  Filed Vitals:   02/01/15 0927  BP: 125/78  Pulse: 82  Height:  (1.854 m)  Weight: 228 lb (103.42 kg)   Body mass index is 30.09 kg/(m^2).  Generalized: Well developed, in no acute distress   Neurological examination  Mentation: Alert. Follows all commands speech and language limited. Cranial nerve II-XII: Pupils were equal round reactive to light. Extraocular movements were full, visual field were full on confrontational test. Facial sensation and strength were normal. Uvula tongue midline. Head turning and shoulder shrug  were normal and symmetric. Motor: The motor testing reveals 5 over 5 strength of all 4 extremities. Good symmetric motor tone is noted throughout.  Contracted right wrist and ankle.  Sensory: Sensory testing is intact to soft touch on all 4 extremities. No evidence of extinction is noted.  Coordination: Cerebellar testing reveals good finger-nose-finger- difficulty on the right due to contracture and good heel-to-shin bilaterally.  Gait and station: diplegic gait. Often dragging the right foot. Tandem gait not attempted. Reflexes: Deep tendon reflexes are symmetric and normal bilaterally.   DIAGNOSTIC DATA (LABS, IMAGING,  TESTING) - I reviewed patient records, labs, notes, testing and imaging myself where available.     ASSESSMENT AND PLAN 39 y.o. year old male  has a past medical history of Sleep apnea; Mental disability; Depression; Anxiety; Obesity; Seizures; Developmental delay; Vision abnormalities; Development delay (02/01/2013); Cerebral palsy, hemiplegic (02/01/2013); Sleep apnea with use of continuous positive airway pressure (CPAP) (02/01/2013); Epileptic seizures (02/01/2013); Falls; and Confusion. here with:  1. Obstructive sleep apnea on CPAP 2. Seizures 3. Cervical palsy  Patient's CPAP compliance is excellent. However his AHI is slightly elevated and the patient does have a significant leak. I have encouraged the patient and his caregiver to tighten his straps on the mask. They verbalized understanding. The patient's seizures have been controlled on Lyrica. I will refill this today. Patient and caregiver advised that if he has any seizure events they should let us know. The patient will follow-up in one year or sooner if needed.   Butch Penny, MSN, NP-C 02/01/2015, 9:26 AM [facility] [address], [address] [place], [place] [address] [phone]

## 2015-02-01 NOTE — Progress Notes (Signed)
I agree with the assessment and plan as directed by NP .The patient is known to me .   Saloni Lablanc, MD  

## 2015-02-01 NOTE — Patient Instructions (Signed)
Continue using the CPAP nightly- tighten straps to avoid leak Continue Lyrica 75 mg daily If you have any seizure events please let us know.  If your symptoms worsen or you develop new symptoms please let us know.

## 2015-08-10 ENCOUNTER — Other Ambulatory Visit: Payer: Self-pay

## 2015-08-10 NOTE — Telephone Encounter (Signed)
Received faxed request (from [facility]) for Lyrica refill. Last OV Oct 2016 w/ 6 mos refills at tha time. Follow-up scheduled Oct 2017.

## 2015-08-13 MED ORDER — PREGABALIN 75 MG PO CAPS: 75.0000 mg | ORAL_CAPSULE | Freq: Every day | ORAL | Status: DC

## 2015-08-13 NOTE — Telephone Encounter (Signed)
Faxed to [phone] confirmation received.  MM/NP.  Just noted message below.

## 2016-02-01 ENCOUNTER — Other Ambulatory Visit: Payer: Self-pay

## 2016-02-01 MED ORDER — PREGABALIN 75 MG PO CAPS: 75.0000 mg | ORAL_CAPSULE | Freq: Every day | ORAL | 0 refills | Status: DC

## 2016-02-01 NOTE — Telephone Encounter (Signed)
30 day rx printed, awaiting signature. Pt has f/u appt next week but is currently out of medication.

## 2016-02-04 ENCOUNTER — Ambulatory Visit: Payer: Medicare Other | Admitting: Adult Health

## 2016-02-04 NOTE — Telephone Encounter (Signed)
Rx printed, signed, faxed to pharmacy. 

## 2016-02-05 ENCOUNTER — Encounter: Payer: Self-pay | Admitting: Adult Health

## 2016-03-04 ENCOUNTER — Ambulatory Visit: Payer: Medicare Other | Admitting: Adult Health

## 2016-03-13 ENCOUNTER — Ambulatory Visit (INDEPENDENT_AMBULATORY_CARE_PROVIDER_SITE_OTHER): Payer: Medicare Other | Admitting: Adult Health

## 2016-03-13 ENCOUNTER — Encounter: Payer: Self-pay | Admitting: Adult Health

## 2016-03-13 VITALS — BP 150/90 | HR 78 | Resp 18 | Ht 73.0 in | Wt 220.0 lb

## 2016-03-13 DIAGNOSIS — R569 Unspecified convulsions: Secondary | ICD-10-CM

## 2016-03-13 DIAGNOSIS — G4733 Obstructive sleep apnea (adult) (pediatric): Secondary | ICD-10-CM | POA: Diagnosis not present

## 2016-03-13 DIAGNOSIS — Z9989 Dependence on other enabling machines and devices: Secondary | ICD-10-CM

## 2016-03-13 MED ORDER — PREGABALIN 75 MG PO CAPS: 75.0000 mg | ORAL_CAPSULE | Freq: Every day | ORAL | 5 refills | Status: DC

## 2016-03-13 NOTE — Patient Instructions (Signed)
CPAP compliance looks good.  Continue Lyrica 75 mg at bedtime If your symptoms worsen or you develop new symptoms please let us know.

## 2016-03-13 NOTE — Progress Notes (Signed)
PATIENT: Mario Willis DOB: 09/18/1975  REASON FOR VISIT: follow up- obstructive sleep apnea on CPAP, seizures HISTORY FROM: patient  HISTORY OF PRESENT ILLNESS: Mario Willis is a [age] year old male with a history of obstructive sleep apnea on CPAP, seizures and cerebral palsy. He returns today for follow-up. His CPAP download indicates that he uses machine 28 out of 30 days for compliance of 93%. Each night he uses his machine 11 hours and 9 minutes. His residual AHI is 4.5 on inspiratory pressure of 20 cm of water and expiratory pressure of 5 cm water with pressure support or 4 cm of water. The patient does have a significant leak in the 95th percentile at 62.3 L/m. Caregiver reports that he tends to get up during the night to use the bathroom and has trouble putting his mask on by himself. He states this is why he has a high leak. The patient continues on Lyrica for seizures. Denies any seizure events. Overall he is doing well. Returns today for an evaluation.  HISTORY 02/01/15: Mario Willis is a 40 year old male with a history of obstructive sleep apnea on CPAP, seizures and cerebral palsy. He returns today for follow-up. The patient CPAP download indicates that he uses his machine 89 out of 90 days for compliance of 93%. On average he uses his machine 10 hours and 4 minutes. He uses machine greater than 4 hours 82 out of 90 days for compliance of 91%. His maximum inspiratory pressure is 20 cm of water and minimum expiratory pressure is 5 cm water with pressure support of 4 cm of water. The patient's residual AHI is 7. The patient does have a significant leak at 75.7 L/m in the 95th percentile. The patient's caregiver reports that typically during the night the mask will slide off his face. He also states that occasionally the patient will get up to use the restroom and will not put the mask back on. The patient's Epworth sleepiness score is 14 and fatigue severity score is 57.The patients seizures  have been well controlled on Lyrica 75 milligrams daily. He denies having any seizure events in the last year. The patient was recently switched from Depakote to Tegretol. The patient was on this medication for his behavior. He had to be taken off of Depakote due to increased ammonia levels per the caregiver. He returns today for an evaluation.  HISTORY (Dohmeier): Mario AllanMichael E Ladley is a 40 y.o. male Is seen here as a referral/ revisit from Dr. Lovell SheehanJenkins.  Is a long-time established patient in our practice or as a child followed Dr. Sharene SkeansHickling for seizure disorder. Mario Willis is by default left-handed as cerebral palsy affected his right side. He has right-sided spastic hemiparesis he has seizures arising from the left hemisphere and has a history of mental retardation. When I originally started to see Mario Willis, he was morbidly obese.  In 2006 is the study was performed revealing an AHI of 86.6 at [facility] , where he was split to CPAP and that at 25 cm water with a fullface mask. There were no adjustments made over the uterus and he had not seen anybody for asleep and medicine evaluation.   In 2012 his CPAP was programmed ,he also had lost 80 pounds and it was likely that he didn't need the same high pressures anymore.  He was re\re titrated and during the sleep study his EEG showed left hemispheric epileptiform discharges. He was titrated to 10 cm water with 2 cm EPR. He  had no active seizures history since at least 2011, and his sleepiness score measured by Epworth questionnaire had been soaking of 21 and his last visit dated 11-12-11. Today he brought his machine with him and the download shows that the patient has a residual AHI of 4.4 has been using the machine for 60 over 60 days. His average time in therapy at night is 10 hours and 43 minutes. The machine is a VPAP bottle showing a medium inspiratory pressure of 10.7 cm and expiratory pressure of 6.7 cm water.  He does have  high air leak however. His download on 11-17-2011 the last available prior to today showed an AHI of 5.8 average daily time and therapy of 9 hours 55 minutes. similar IPAP and EPAP pressures. Based on these data, there is no change necessary  01-31-14;  The patient apears unchanged to last years visit , and his caretaker filled his Epworth score at 16 points. He has some confusion about date and day of the week. This seems new to his caretaker.  He is scheduled to see Dr Jacqulyn Bath at [facility] for a treatment of the contracted ankles and wrist of the right hand.  He needs refills for seizure medication , Lyrica at 75 mg daily,  and a compliance download For this yearly Rv is due today. CPAP today, 88% compliance over the last 90 days.  AHI is 6.3, higher than desired.  His user time is 8 hours and 39 minutes. Very high air leak. New mask needed.   REVIEW OF SYSTEMS: Out of a complete 14 system review of symptoms, the patient complains only of the following symptoms, and all other reviewed systems are negative.  Frequency of urination, behavior problem, confusion, snoring, chills  ALLERGIES: Allergies  Allergen Reactions  . Gabapentin     HOME MEDICATIONS: Outpatient Medications Prior to Visit  Medication Sig Dispense Refill  . carbamazepine (TEGRETOL) 100 MG chewable tablet Chew 200 mg by mouth 2 (two) times daily.    . clonazePAM (KLONOPIN) 1 MG tablet Take 1 mg by mouth 3 (three) times daily as needed for anxiety.    Marland Kitchen desmopressin (DDAVP) 0.2 MG tablet Take 0.2 mg by mouth daily. At night for insipidus.    Marland Kitchen diltiazem (DILACOR XR) 240 MG 24 hr capsule Take 1 capsule (240 mg total) by mouth daily. 30 capsule 11  . docusate sodium (STOOL SOFTENER) 100 MG capsule 60 mg 2 (two) times daily. 1 pill 2 x a day    . FLUoxetine (PROZAC) 20 MG capsule Take 20 mg by mouth at bedtime.     . IRON PO Take by mouth daily.    . montelukast (SINGULAIR) 10 MG tablet Take 10 mg by mouth  every morning. 1 pill in the am    . polyethylene glycol (MIRALAX / GLYCOLAX) packet Take 17 g by mouth daily as needed.    Marland Kitchen QUEtiapine (SEROQUEL) 300 MG tablet Take 300 mg by mouth at bedtime. 2 at 8 Pm po.    . pregabalin (LYRICA) 75 MG capsule Take 1 capsule (75 mg total) by mouth at bedtime. 30 capsule 0  . cetirizine (ZYRTEC) 10 MG tablet Take 10 mg by mouth.     No facility-administered medications prior to visit.     PAST MEDICAL HISTORY: Past Medical History:  Diagnosis Date  . Anxiety   . Cerebral palsy, hemiplegic (HCC) 02/01/2013  . Confusion    since 7-8 mo ago.  . Depression   . Development  delay 02/01/2013  . Developmental delay   . Epileptic seizures (HCC) 02/01/2013  . Falls    increased falls since 7-8 mo ago  . Mental disability    childhood  . Obesity   . Seizures (HCC)   . Sleep apnea   . Sleep apnea with use of continuous positive airway pressure (CPAP) 02/01/2013   Severe apnea with asv , IPAP and EPAP  Not set, auto function.   . Vision abnormalities     PAST SURGICAL HISTORY: Past Surgical History:  Procedure Laterality Date  . COLONOSCOPY      FAMILY HISTORY: No family history on file.  SOCIAL HISTORY: Social History   Social History  . Marital status: Single    Spouse name: N/A  . Number of children: N/A  . Years of education: N/A   Occupational History  . Not on file.   Social History Main Topics  . Smoking status: Never Smoker  . Smokeless tobacco: Never Used  . Alcohol use No  . Drug use: No  . Sexual activity: No   Other Topics Concern  . Not on file   Social History Narrative   Left handed, NO caffeine, [employer].  Works at a shop (skills).  (non pay).       PHYSICAL EXAM  Vitals:   03/13/16 0949  BP: (!) 150/90  Pulse: 78  Resp: 18  Weight: 220 lb (99.8 kg)  Height: 6\' 1"  (1.854 m)   Body mass index is 29.03 kg/m.  Generalized: Well developed, in no acute distress   Neurological  examination  Mentation: Alert. Follows all commands speech and language limited. Cranial nerve II-XII: Pupils were equal round reactive to light. Extraocular movements were full, visual field were full on confrontational test. Facial sensation and strength were normal. Uvula tongue midline. Head turning and shoulder shrug  were normal and symmetric. Motor: The motor testing reveals 5 over 5 strength of all 4 extremities. Good symmetric motor tone is noted throughout.  Contracted right wrist and ankle.  Sensory: Sensory testing is intact to soft touch on all 4 extremities. No evidence of extinction is noted.  Coordination: Cerebellar testing reveals good finger-nose-finger- difficulty on the right due to contracture and good heel-to-shin bilaterally.  Gait and station: diplegic gait. Often dragging the right foot. Tandem gait not attempted. Reflexes: Deep tendon reflexes are symmetric and normal bilaterally.   DIAGNOSTIC DATA (LABS, IMAGING, TESTING) - I reviewed patient records, labs, notes, testing and imaging myself where available.     ASSESSMENT AND PLAN 40 y.o. year old male  has a past medical history of Anxiety; Cerebral palsy, hemiplegic (HCC) (02/01/2013); Confusion; Depression; Development delay (02/01/2013); Developmental delay; Epileptic seizures (HCC) (02/01/2013); Falls; Mental disability; Obesity; Seizures (HCC); Sleep apnea; Sleep apnea with use of continuous positive airway pressure (CPAP) (02/01/2013); and Vision abnormalities. here with:  1. Obstructive sleep apnea on CPAP 2. Seizures  The patient does have a significant leak with his CPAP. However this is due to him having trouble putting the mask back on correctly during the middle the night. He will continue on Lyrica 75 mg daily for seizures. Advised that if his symptoms worsen or he develops any new symptoms he should let us know. Will follow-up in one year or sooner if needed.     Butch PennyMegan Adasha Boehme, MSN, NP-C  03/13/2016, 10:12 AM [facility] [address], [address] [place], [place] [id number] [phone]

## 2016-03-13 NOTE — Progress Notes (Signed)
I agree with the assessment and plan as directed by NP .The patient is known to me .   Coron Rossano, MD  

## 2016-08-21 ENCOUNTER — Other Ambulatory Visit: Payer: Self-pay | Admitting: Neurology

## 2016-08-25 NOTE — Telephone Encounter (Signed)
Fax confirmation for lyrica [facility] received.

## 2017-01-06 ENCOUNTER — Other Ambulatory Visit: Payer: Self-pay | Admitting: Adult Health

## 2017-01-07 NOTE — Telephone Encounter (Signed)
Fax confirmation received lyrica, [facility] [phone]. sy

## 2017-01-12 ENCOUNTER — Telehealth: Payer: Self-pay | Admitting: Neurology

## 2017-01-12 DIAGNOSIS — G4733 Obstructive sleep apnea (adult) (pediatric): Secondary | ICD-10-CM

## 2017-01-12 DIAGNOSIS — Z9989 Dependence on other enabling machines and devices: Principal | ICD-10-CM

## 2017-01-12 NOTE — Telephone Encounter (Signed)
Pt care taker has called re: Pt CPAP unit through [facility] (ph#[phone]#[phone]) they are willing to replace pt CPAP because it is time but they need the order from Dr Vickey Huger,  Adam(pt care taker) is asking that  [facility] be called so pt can get new CPAP as soon as possible to avoid hospitalization for the pt

## 2017-01-12 NOTE — Telephone Encounter (Signed)
Called to inform the patient that we would fax over new orders.

## 2017-01-12 NOTE — Addendum Note (Signed)
Addended by: Melvyn Novas on: 01/12/2017 10:00 AM   Modules accepted: Orders

## 2017-03-16 ENCOUNTER — Ambulatory Visit: Payer: Medicare Other | Admitting: Adult Health

## 2017-04-30 ENCOUNTER — Encounter: Payer: Self-pay | Admitting: Neurology

## 2017-04-30 ENCOUNTER — Telehealth: Payer: Self-pay | Admitting: *Deleted

## 2017-04-30 ENCOUNTER — Ambulatory Visit (INDEPENDENT_AMBULATORY_CARE_PROVIDER_SITE_OTHER): Payer: Medicare Other | Admitting: Neurology

## 2017-04-30 VITALS — BP 135/76 | HR 76 | Ht 72.0 in | Wt 206.0 lb

## 2017-04-30 DIAGNOSIS — G4733 Obstructive sleep apnea (adult) (pediatric): Secondary | ICD-10-CM | POA: Diagnosis not present

## 2017-04-30 DIAGNOSIS — G40209 Localization-related (focal) (partial) symptomatic epilepsy and epileptic syndromes with complex partial seizures, not intractable, without status epilepticus: Secondary | ICD-10-CM | POA: Insufficient documentation

## 2017-04-30 DIAGNOSIS — I69351 Hemiplegia and hemiparesis following cerebral infarction affecting right dominant side: Secondary | ICD-10-CM | POA: Diagnosis not present

## 2017-04-30 DIAGNOSIS — G808 Other cerebral palsy: Secondary | ICD-10-CM | POA: Diagnosis not present

## 2017-04-30 DIAGNOSIS — Z9989 Dependence on other enabling machines and devices: Secondary | ICD-10-CM

## 2017-04-30 DIAGNOSIS — H547 Unspecified visual loss: Secondary | ICD-10-CM | POA: Diagnosis not present

## 2017-04-30 MED ORDER — PREGABALIN 75 MG PO CAPS: 75.0000 mg | ORAL_CAPSULE | Freq: Every day | ORAL | 5 refills | Status: DC

## 2017-04-30 MED ORDER — CARBAMAZEPINE 100 MG PO CHEW: 200.0000 mg | CHEWABLE_TABLET | Freq: Two times a day (BID) | ORAL | 11 refills | Status: AC

## 2017-04-30 NOTE — Progress Notes (Signed)
PATIENT: Mario Willis DOB: 1976/02/28  REASON FOR VISIT: follow up- obstructive sleep apnea on CPAP, seizures HISTORY FROM: patient  HISTORY OF PRESENT ILLNESS:   04-30-2017 , CD Mario Willis presents today as a now 42 year old African-American gentleman with a right hemiparesis, seizure disorder, and obstructive sleep apnea patient on CPAP.  He received a new machine about 6 weeks ago, and has had excellent compliance.  He has used the machine on average 10 hours and 29 minutes, he takes nighttime medications but have assured that he sleeps deep and restful.  His CPAP now is an AutoSet allowing a pressure between 5 and 15 cm water. The residual apnea index is 6.2 of which 2.2 a central and 2.1 obstructive in nature, a little higher than I like it, but still overall good reduction in his apnea count.  He does have a very large air leakage and this may erroneously count for higher apneas.  95th percentile pressure is at 12 cmH2O. He has several nocturias and the leakage comes in when he returns to bed.  No seizure activity in years- it was always questioned if he has epilepsy or if he had behavior problems. Marland Kitchen. He lives in a group home.    Mario Willis is a 42 year old male with a history of obstructive sleep apnea on CPAP, seizures and cerebral palsy. He returns today for follow-up. His CPAP download indicates that he uses machine 28 out of 30 days for compliance of 93%. Each night he uses his machine 11 hours and 9 minutes. His residual AHI is 4.5 on inspiratory pressure of 20 cm of water and expiratory pressure of 5 cm water with pressure support or 4 cm of water. The patient does have a significant leak in the 95th percentile at 62.3 L/m. Caregiver reports that he tends to get up during the night to use the bathroom and has trouble putting his mask on by himself. He states this is why he has a high leak. The patient continues on Lyrica for seizures. Denies any seizure events. Overall he is  doing well. Returns today for an evaluation.  HISTORY 02/01/15: Mario Willis is a 42 year old male with a history of obstructive sleep apnea on CPAP, seizures and cerebral palsy. He returns today for follow-up. The patient CPAP download indicates that he uses his machine 89 out of 90 days for compliance of 93%. On average he uses his machine 10 hours and 4 minutes. He uses machine greater than 4 hours 82 out of 90 days for compliance of 91%. His maximum inspiratory pressure is 20 cm of water and minimum expiratory pressure is 5 cm water with pressure support of 4 cm of water. The patient's residual AHI is 7. The patient does have a significant leak at 75.7 L/m in the 95th percentile. The patient's caregiver reports that typically during the night the mask will slide off his face. He also states that occasionally the patient will get up to use the restroom and will not put the mask back on. The patient's Epworth sleepiness score is 14 and fatigue severity score is 57.The patients seizures have been well controlled on Lyrica 75 milligrams daily. He denies having any seizure events in the last year. The patient was recently switched from Depakote to Tegretol. The patient was on this medication for his behavior. He had to be taken off of Depakote due to increased ammonia levels per the caregiver.   HISTORY (Mario Willis): Mario Willis is a 42 y.o.  male Is seen here as a referral/ revisit from Dr. Lovell Sheehan.  Is a long-time established patient in our practice or as a child followed Dr. Sharene Skeans for seizure disorder. Mario Willis is by default left-handed as cerebral palsy affected his right side. He has right-sided spastic hemiparesis he has seizures arising from the left hemisphere and has a history of mental retardation. When I originally started to see Mario Willis, he was morbidly obese.  In 2006 is the study was performed revealing an AHI of 86.6 at [facility] , where he was split to CPAP and that at  25 cm water with a fullface mask. There were no adjustments made over the uterus and he had not seen anybody for asleep and medicine evaluation.   In 2012 his CPAP was programmed ,he also had lost 80 pounds and it was likely that he didn't need the same high pressures anymore.  He was re\re titrated and during the sleep study his EEG showed left hemispheric epileptiform discharges. He was titrated to 10 cm water with 2 cm EPR. He had no active seizures history since at least 2011, and his sleepiness score measured by Epworth questionnaire had been soaking of 21 and his last visit dated 11-12-11. Today he brought his machine with him and the download shows that the patient has a residual AHI of 4.4 has been using the machine for 60 over 60 days. His average time in therapy at night is 10 hours and 43 minutes. The machine is a VPAP bottle showing a medium inspiratory pressure of 10.7 cm and expiratory pressure of 6.7 cm water.  He does have high air leak however. His download on 11-17-2011 the last available prior to today showed an AHI of 5.8 average daily time and therapy of 9 hours 55 minutes. similar IPAP and EPAP pressures. Based on these data, there is no change necessary  01-31-14;  The patient apears unchanged to last years visit , and his caretaker filled his Epworth score at 16 points. He has some confusion about date and day of the week. This seems new to his caretaker.  He is scheduled to see Dr Jacqulyn Bath at [facility] for a treatment of the contracted ankles and wrist of the right hand.  He needs refills for seizure medication , Lyrica at 75 mg daily,  and a compliance download For this yearly Rv is due today. CPAP today, 88% compliance over the last 90 days.  AHI is 6.3, higher than desired.  His user time is 8 hours and 39 minutes. Very high air leak. New mask needed.   REVIEW OF SYSTEMS: Out of a complete 14 system review of symptoms, the patient complains only of the  following symptoms, and all other reviewed systems are negative.  Frequency of urination, behavior problem, confusion, snoring, chills  ALLERGIES: Allergies  Allergen Reactions  . Gabapentin     HOME MEDICATIONS: Outpatient Medications Prior to Visit  Medication Sig Dispense Refill  . Asenapine Maleate 2.5 MG SUBL Place 5 mg under the tongue.    . carbamazepine (TEGRETOL) 100 MG chewable tablet Chew 200 mg by mouth 2 (two) times daily.    . clonazePAM (KLONOPIN) 1 MG tablet Take 1 mg by mouth 3 (three) times daily as needed for anxiety.    Marland Kitchen desmopressin (DDAVP) 0.2 MG tablet Take 0.2 mg by mouth daily. At night for insipidus.    Marland Kitchen diltiazem (DILACOR XR) 240 MG 24 hr capsule Take 1 capsule (240 mg total) by mouth  daily. 30 capsule 11  . docusate sodium (STOOL SOFTENER) 100 MG capsule 60 mg 2 (two) times daily. 1 pill 2 x a day    . FLUoxetine (PROZAC) 20 MG capsule Take 20 mg by mouth at bedtime.     . IRON PO Take by mouth daily.    Marland Kitchen LYRICA 75 MG capsule TAKE ONE CAPSULE BY MOUTH EVERY NIGHT AT BEDTIME 30 capsule 5  . montelukast (SINGULAIR) 10 MG tablet Take 10 mg by mouth every morning. 1 pill in the am    . polyethylene glycol (MIRALAX / GLYCOLAX) packet Take 17 g by mouth daily as needed.    Marland Kitchen QUEtiapine (SEROQUEL) 300 MG tablet Take 300 mg by mouth at bedtime. 2 at 8 Pm po.    . cetirizine (ZYRTEC) 10 MG tablet Take 10 mg by mouth.     No facility-administered medications prior to visit.     PAST MEDICAL HISTORY: Past Medical History:  Diagnosis Date  . Anxiety   . Cerebral palsy, hemiplegic (HCC) 02/01/2013  . Confusion    since 7-8 mo ago.  . Depression   . Development delay 02/01/2013  . Developmental delay   . Epileptic seizures (HCC) 02/01/2013  . Falls    increased falls since 7-8 mo ago  . Mental disability    childhood  . Obesity   . Seizures (HCC)   . Sleep apnea   . Sleep apnea with use of continuous positive airway pressure (CPAP) 02/01/2013   Severe  apnea with asv , IPAP and EPAP  Not set, auto function.   . Vision abnormalities     PAST SURGICAL HISTORY: Past Surgical History:  Procedure Laterality Date  . COLONOSCOPY      FAMILY HISTORY: No family history on file.  SOCIAL HISTORY: Social History   Socioeconomic History  . Marital status: Single    Spouse name: Not on file  . Number of children: Not on file  . Years of education: Not on file  . Highest education level: Not on file  Social Needs  . Financial resource strain: Not on file  . Food insecurity - worry: Not on file  . Food insecurity - inability: Not on file  . Transportation needs - medical: Not on file  . Transportation needs - non-medical: Not on file  Occupational History  . Not on file  Tobacco Use  . Smoking status: Never Smoker  . Smokeless tobacco: Never Used  Substance and Sexual Activity  . Alcohol use: No  . Drug use: No  . Sexual activity: No  Other Topics Concern  . Not on file  Social History Narrative   Left handed, NO caffeine, [employer].  Works at a shop (skills).  (non pay).       PHYSICAL EXAM  Vitals:   04/30/17 1117  BP: 135/76  Pulse: 76  Weight: 206 lb (93.4 kg)  Height: 6' (1.829 m)   Body mass index is 27.94 kg/m.  Generalized: Well developed, in no acute distress  Noticeable right hemiparesis affecting slightly his face, mostly the right upper extremity ( flexion) , and spastic extension of the right lower extremity.  Neurological examination  Mentation: Alert. Follows all commands speech and language limited. Cranial nerve ; there is no reported loss of taste or smell sensation pupils were equal round reactive to light. Abnormal extraocular movements -the left I does not accommodate, there is primary nystagmus with eye movements in all planes, he is legally blind in  the left eye- but with preserved color vision. He can only identify small objects and printed matters upon placing the paper close  to his eyes.  Extreme nearsightedness.  Uvula tongue devi of motion for for head tilting, bending and  turning and is no right-sided shoulder shrug possible, the left is intact . Motor: The motor testing reveals 5 over 5 strength of all 4 extremities.  Normal motor tone throughout the right body hemisphere with everted right foot, limp, extended lower right extremity and flexed upper right extremity at the elbowwith contracture- Contracted right wrist and ankle on the right.  Sensory: Sensory testing deferred.  Gait and station: diplegic gait. Often dragging the right foot.  Tandem gait not attempted. hemiparetic gait.   DIAGNOSTIC DATA (LABS, IMAGING, TESTING) - I reviewed patient records, labs, notes, testing and imaging myself where available.  None  ASSESSMENT AND PLAN 42 y.o. year old male  here with:  1. Obstructive sleep apnea on CPAP, he was morbidly obese - he is now slim.  2. Epilepsy/ Seizures -in 2016 he developed abnormal liver function tests, elevated transaminases and had to be switched from Depakote to Tegretol and is also on Seroquel's generic form 400 mg 2 pills daily at 8 PM he takes fluoxetine the generic Prozac 20 mg 3 pills daily, diltiazem for blood pressure 3 and 60 mg daily, desmopressin 0.2 mg 2 pills at night controlling nocturia.  Clonazepam 1 mg 3 times a day, carbamazepine extended release 200 mg twice a day ,Lyrica 75 daily. . 3. Mental retardation, developmental delay 4. Right hemiparesis, congenital- suspect cerebral palsy .  The patient does have a significant leak with his CPAP. However this is due to him having trouble putting the mask back on correctly during the middle the night.  I am not sure how to improve upon the air leaks, his apnea is definitely much better controlled and without CPAP.  We may indirectly help him by reducing his bathroom breaks at night.  He will continue on Lyrica 75 mg daily for seizures/ behavior.     Will follow-up in one year  or sooner if needed with NP. Please review my physical exam, eye exam and ataxia, hemiparesis.    Melvyn Novas, MD  04/30/2017, 11:43 AM [facility] [address], [address] [place], [place] [address] [phone]

## 2017-04-30 NOTE — Telephone Encounter (Signed)
Faxed Lyrica prescription to [facility]. Received a receipt of confirmation.

## 2017-10-27 ENCOUNTER — Other Ambulatory Visit: Payer: Self-pay | Admitting: Neurology

## 2018-04-06 ENCOUNTER — Other Ambulatory Visit: Payer: Self-pay | Admitting: Neurology

## 2018-04-15 ENCOUNTER — Telehealth: Payer: Self-pay | Admitting: Adult Health

## 2018-04-15 NOTE — Telephone Encounter (Signed)
If he is able to see his PCP sooner we can wait for their evaluation and keep his appt 1/27.

## 2018-04-15 NOTE — Telephone Encounter (Signed)
Spoke with caregiver Madelaine Bhat and reminded him of patient's follow up on 05/03/18. Advised him the NP doesn't have any openings but may be able to work him in sooner. I asked Adam if he has hit his head; Madelaine Bhat stated he is not certain since the patient is a special needs person and may not report that. I advised that he take him to ED if he does see him fall or patient reports a fall and Adam notices concerning changes in is behavior.  I advised will talk with NP to see if he can be seen sooner, will call him back later.  Adam verbalized understanding and then stated the patient has inner ear issues. I advised that may cause falls. Adam has called PCP to see when patient can be seen to check his ears.  Note routed to NP.

## 2018-04-15 NOTE — Telephone Encounter (Signed)
LVM for Mario Willis advising him that if PCP can see patient before Jan 27 we will keep his FU on that date. PCP can evaluate him before being seen here. Advised that if this is not acceptable please call back.

## 2018-04-15 NOTE — Telephone Encounter (Signed)
Pts caretaker Madelaine Bhat (on [place]) is concerned that something might be going on in pts brain due to several falls that he has had recently. Care taker Madelaine Bhat was wanting to know if an MRI would be something that is necessary. Please advise.

## 2018-05-03 ENCOUNTER — Ambulatory Visit: Payer: Medicare Other | Admitting: Adult Health

## 2018-05-03 ENCOUNTER — Encounter: Payer: Self-pay | Admitting: Neurology

## 2018-05-03 ENCOUNTER — Telehealth: Payer: Self-pay | Admitting: Neurology

## 2018-05-03 NOTE — Telephone Encounter (Signed)
Called the husband and he asked that I called the wife. They took the patient to pcp for balance concerns. When he went to pcp they saw he had ear wax and they cleaned that out. 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 I have offered 2:30 pm apt for 05/04/2018. I have advised the patient's caregiver that we can get in touch with [facility] to get the updated records on this matter and CT scan results. Dr Darryl Lent is his MD at [facility] and they advised the patient follow up here.

## 2018-05-03 NOTE — Telephone Encounter (Signed)
Pts caretaker called wanting to get advise and a second opinion on a diagnosis that was given to the pt from his PCP of obstructive hydrocephalus. Please advise.

## 2018-05-04 ENCOUNTER — Ambulatory Visit (INDEPENDENT_AMBULATORY_CARE_PROVIDER_SITE_OTHER): Payer: Medicare Other | Admitting: Neurology

## 2018-05-04 ENCOUNTER — Encounter: Payer: Self-pay | Admitting: Neurology

## 2018-05-04 VITALS — BP 144/89 | HR 77 | Ht 73.0 in | Wt 204.0 lb

## 2018-05-04 DIAGNOSIS — G802 Spastic hemiplegic cerebral palsy: Secondary | ICD-10-CM

## 2018-05-04 DIAGNOSIS — R569 Unspecified convulsions: Secondary | ICD-10-CM

## 2018-05-04 DIAGNOSIS — G4733 Obstructive sleep apnea (adult) (pediatric): Secondary | ICD-10-CM

## 2018-05-04 DIAGNOSIS — G9349 Other encephalopathy: Secondary | ICD-10-CM | POA: Diagnosis not present

## 2018-05-04 DIAGNOSIS — Z9989 Dependence on other enabling machines and devices: Secondary | ICD-10-CM

## 2018-05-04 MED ORDER — PREGABALIN 75 MG PO CAPS
75.0000 mg | ORAL_CAPSULE | Freq: Every day | ORAL | 3 refills | Status: DC
Start: 2018-05-04 — End: 2018-11-11

## 2018-05-04 NOTE — Progress Notes (Signed)
PATIENT: Dana AllanMichael E Creamer DOB: 08/11/1975  REASON FOR VISIT: follow up- obstructive sleep apnea on CPAP, seizures, cognitive delay, hemiparesis.   HISTORY FROM: patient and caregiver   HISTORY OF PRESENT ILLNESS:  05-04-2018, RV. Chrystine OilerMichael a Tillis is seen here today as admitted by 43 year old African-American gentleman with a history of hemiparesis, spasticity, and he used to be morbidly obese but I followed him initially but lost a lot of weight.  However he is still CPAP dependent has obstructive sleep apneas and a history of seizures, of which none have been seen in over a decade!  He has also a malignant thickened cognitive delay, visual acuity loss and lives with 2 other charges in a private  AFL home.   Prior to Christmas he had fallen at the day program, riding an escalator. He was bleeding. He  was seen by a new PA, Darryl LentAmanda Taylor and she ordered a CT but did not include the scan into her referral notes.  He has had images 2006/2007 Clinical Data:  Altered level of consciousness.  Medical clearance.   HEAD CT WITHOUT CONTRAST: Technique:  Contiguous axial images were obtained from the base of the skull through the vertex according to standard protocol without contrast. Comparison:  02/11/05 Marked ventriculomegaly particularly on the left again noted and stable.  Vermian calcification also stable.  Significant atrophic change in the left hemisphere is compared to the right.  No interval change from the prior exam.  No extraaxial fluid collections, intracranial hemorrhage or acute stroke. IMPRESSION:  1.  Significant perinatal insult with ventriculomegaly left greater than right stable when compared with MRI of 02/11/05. 2.  No acute intracranial findings identified.  Please note that acute stroke may be occult for 24-48 hours on CT.   Provider: Molly Maduroobert May        04-30-2017 , CD Mr. Wynelle BeckmannGillis presents today as a now 43 year old African-American gentleman with a right hemiparesis,  seizure disorder, and obstructive sleep apnea patient on CPAP.  He received a new machine about 6 weeks ago, and has had excellent compliance.  He has used the machine on average 10 hours and 29 minutes, he takes nighttime medications but have assured that he sleeps deep and restful.  His CPAP now is an AutoSet allowing a pressure between 5 and 15 cm water. The residual apnea index is 6.2 of which 2.2 a central and 2.1 obstructive in nature, a little higher than I like it, but still overall good reduction in his apnea count.  He does have a very large air leakage and this may erroneously count for higher apneas.  95th percentile pressure is at 12 cmH2O. He has several nocturias and the leakage comes in when he returns to bed.  No seizure activity in years- it was always questioned if he has epilepsy or if he had behavior problems. Marland Kitchen. He lives in a group home.    MM- Mr. Wynelle BeckmannGillis is a 43 year old male with a history of obstructive sleep apnea on CPAP, seizures and cerebral palsy. He returns today for follow-up. His CPAP download indicates that he uses machine 28 out of 30 days for compliance of 93%. Each night he uses his machine 11 hours and 9 minutes. His residual AHI is 4.5 on inspiratory pressure of 20 cm of water and expiratory pressure of 5 cm water with pressure support or 4 cm of water. The patient does have a significant leak in the 95th percentile at 62.3 L/m. Caregiver reports that he tends  to get up during the night to use the bathroom and has trouble putting his mask on by himself. He states this is why he has a high leak. The patient continues on Lyrica for seizures. Denies any seizure events. Overall he is doing well. Returns today for an evaluation.  HISTORY 02/01/15: Mr. Kann is a 43 year old male with a history of obstructive sleep apnea on CPAP, seizures and cerebral palsy. He returns today for follow-up. The patient CPAP download indicates that he uses his machine 89 out of 90 days for  compliance of 93%. On average he uses his machine 10 hours and 4 minutes. He uses machine greater than 4 hours 82 out of 90 days for compliance of 91%. His maximum inspiratory pressure is 20 cm of water and minimum expiratory pressure is 5 cm water with pressure support of 4 cm of water. The patient's residual AHI is 7. The patient does have a significant leak at 75.7 L/m in the 95th percentile. The patient's caregiver reports that typically during the night the mask will slide off his face. He also states that occasionally the patient will get up to use the restroom and will not put the mask back on. The patient's Epworth sleepiness score is 14 and fatigue severity score is 57.The patients seizures have been well controlled on Lyrica 75 milligrams daily. He denies having any seizure events in the last year. The patient was recently switched from Depakote to Tegretol. The patient was on this medication for his behavior. He had to be taken off of Depakote due to increased ammonia levels per the caregiver.   HISTORY (Almon Whitford): MARTAVIOUS HARTEL is a 43 y.o. male Is seen here as a referral/ revisit from Dr. Lovell Sheehan.  Is a long-time established patient in our practice or as a child followed Dr. Sharene Skeans for seizure disorder. Sixto is by default left-handed as cerebral palsy affected his right side. He has right-sided spastic hemiparesis he has seizures arising from the left hemisphere and has a history of mental retardation. When I originally started to see Shion, he was morbidly obese.  In 2006 is the study was performed revealing an AHI of 86.6 at [facility] , where he was split to CPAP and that at 25 cm water with a fullface mask. There were no adjustments made over the uterus and he had not seen anybody for asleep and medicine evaluation.   In 2012 his CPAP was programmed ,he also had lost 80 pounds and it was likely that he didn't need the same high pressures anymore.  He was  re\re titrated and during the sleep study his EEG showed left hemispheric epileptiform discharges. He was titrated to 10 cm water with 2 cm EPR. He had no active seizures history since at least 2011, and his sleepiness score measured by Epworth questionnaire had been soaking of 21 and his last visit dated 11-12-11. Today he brought his machine with him and the download shows that the patient has a residual AHI of 4.4 has been using the machine for 60 over 60 days. His average time in therapy at night is 10 hours and 43 minutes. The machine is a VPAP bottle showing a medium inspiratory pressure of 10.7 cm and expiratory pressure of 6.7 cm water.  He does have high air leak however. His download on 11-17-2011 the last available prior to today showed an AHI of 5.8 average daily time and therapy of 9 hours 55 minutes. similar IPAP and EPAP pressures. Based  on these data, there is no change necessary  01-31-14;  The patient apears unchanged to last years visit , and his caretaker filled his Epworth score at 16 points. He has some confusion about date and day of the week. This seems new to his caretaker.  He is scheduled to see Dr Jacqulyn Bath at [facility] for a treatment of the contracted ankles and wrist of the right hand.  He needs refills for seizure medication , Lyrica at 75 mg daily,  and a compliance download For this yearly Rv is due today. CPAP today, 88% compliance over the last 90 days.  AHI is 6.3, higher than desired.  His user time is 8 hours and 39 minutes. Very high air leak. New mask needed.   REVIEW OF SYSTEMS: Out of a complete 14 system review of symptoms, the patient complains only of the following symptoms, and all other reviewed systems are negative.  Frequency of urination, behavior problem, confusion, snoring, chills  ALLERGIES: Allergies  Allergen Reactions  . Gabapentin     HOME MEDICATIONS: Outpatient Medications Prior to Visit  Medication Sig Dispense Refill    . Asenapine Maleate 2.5 MG SUBL Place 5 mg under the tongue.    . benztropine (COGENTIN) 0.5 MG tablet Take 0.5 mg by mouth 2 (two) times daily.    . carbamazepine (TEGRETOL) 100 MG chewable tablet Chew 2 tablets (200 mg total) by mouth 2 (two) times daily. 60 tablet 11  . cetirizine (ZYRTEC) 10 MG tablet Take 10 mg by mouth daily.    . clonazePAM (KLONOPIN) 1 MG tablet Take 1 mg by mouth 3 (three) times daily as needed for anxiety.    Marland Kitchen desmopressin (DDAVP) 0.2 MG tablet Take 0.2 mg by mouth daily. At night for insipidus.    Marland Kitchen diltiazem (DILACOR XR) 240 MG 24 hr capsule Take 1 capsule (240 mg total) by mouth daily. (Patient taking differently: Take 360 mg by mouth daily. ) 30 capsule 11  . diltiazem (TIAZAC) 360 MG 24 hr capsule TAKE ONE CAPSULE BY MOUTH DAILY AT 8AM    . docusate sodium (STOOL SOFTENER) 100 MG capsule 60 mg 2 (two) times daily. 1 pill 2 x a day    . ferrous sulfate 325 (65 FE) MG tablet Take 325 mg by mouth daily with breakfast.    . FLUoxetine (PROZAC) 20 MG capsule Take 20 mg by mouth at bedtime.     . IRON PO Take by mouth daily.    . montelukast (SINGULAIR) 10 MG tablet Take 10 mg by mouth every morning. 1 pill in the am    . polyethylene glycol (MIRALAX / GLYCOLAX) packet Take 17 g by mouth daily as needed.    . pregabalin (LYRICA) 75 MG capsule TAKE ONE CAPSULE BY MOUTH AT BEDTIME DAILY 30 capsule 0  . QUEtiapine (SEROQUEL XR) 400 MG 24 hr tablet Take 800 mg by mouth at bedtime.    Marland Kitchen QUEtiapine (SEROQUEL) 300 MG tablet Take 300 mg by mouth at bedtime. 2 at 8 Pm po.    . cetirizine (ZYRTEC) 10 MG tablet Take 10 mg by mouth.     No facility-administered medications prior to visit.     PAST MEDICAL HISTORY: Past Medical History:  Diagnosis Date  . Anxiety   . Cerebral palsy, hemiplegic (HCC) 02/01/2013  . Confusion    since 7-8 mo ago.  . Depression   . Development delay 02/01/2013  . Developmental delay   . Epileptic seizures (HCC) 02/01/2013  .  Falls     increased falls since 7-8 mo ago  . Mental disability    childhood  . Obesity   . Seizures (HCC)   . Sleep apnea   . Sleep apnea with use of continuous positive airway pressure (CPAP) 02/01/2013   Severe apnea with asv , IPAP and EPAP  Not set, auto function.   . Vision abnormalities     PAST SURGICAL HISTORY: Past Surgical History:  Procedure Laterality Date  . COLONOSCOPY      FAMILY HISTORY: No family history on file.  SOCIAL HISTORY: Social History   Socioeconomic History  . Marital status: Single    Spouse name: Not on file  . Number of children: Not on file  . Years of education: Not on file  . Highest education level: Not on file  Occupational History  . Not on file  Social Needs  . Financial resource strain: Not on file  . Food insecurity:    Worry: Not on file    Inability: Not on file  . Transportation needs:    Medical: Not on file    Non-medical: Not on file  Tobacco Use  . Smoking status: Never Smoker  . Smokeless tobacco: Never Used  Substance and Sexual Activity  . Alcohol use: No  . Drug use: No  . Sexual activity: Never  Lifestyle  . Physical activity:    Days per week: Not on file    Minutes per session: Not on file  . Stress: Not on file  Relationships  . Social connections:    Talks on phone: Not on file    Gets together: Not on file    Attends religious service: Not on file    Active member of club or organization: Not on file    Attends meetings of clubs or organizations: Not on file    Relationship status: Not on file  . Intimate partner violence:    Fear of current or ex partner: Not on file    Emotionally abused: Not on file    Physically abused: Not on file    Forced sexual activity: Not on file  Other Topics Concern  . Not on file  Social History Narrative   Left handed, NO caffeine, [employer] [facility] [employer].  Works at a shop (skills).  (non pay).       PHYSICAL EXAM  Vitals:   05/04/18 1419  BP: (!) 144/89    Pulse: 77  Weight: 204 lb (92.5 kg)  Height: 6\' 1"  (1.854 m)   Body mass index is 26.91 kg/m.  Generalized: Well developed, in no acute distress  Noticeable right hemiparesis affecting slightly his face, mostly the right upper extremity ( flexion) , and spastic extension of the right lower extremity.  Neurological examination  Mentation: Alert. Follows all commands speech and language limited. Cranial nerve ; there is no reported loss of taste or smell sensation pupils were equal round reactive to light. Abnormal extraocular movements -the left I does not accommodate, there is primary nystagmus with eye movements in all planes, he is legally blind in the left eye- but with preserved color vision. He can only identify small objects and printed matters upon placing the paper close to his eyes.  Extreme nearsightedness.  Uvula tongue devi of motion for for head tilting, bending and  turning and is no right-sided shoulder shrug possible, the left is intact . Motor: The motor testing reveals 5 over 5 strength of all 4 extremities.  Normal motor  tone throughout the right body hemisphere with everted right foot, limp, extended lower right extremity and flexed upper right extremity at the elbowwith contracture- Contracted right wrist and ankle on the right.  Sensory: Sensory testing deferred.  Gait and station: diplegic gait. Often dragging the right foot.  Tandem gait not attempted. hemiparetic gait.   DIAGNOSTIC DATA (LABS, IMAGING, TESTING) - I reviewed patient records, labs, notes, testing and imaging myself where available.  No  Results of [facility]'s CT scan with and without contrast.   ASSESSMENT AND PLAN 43 y.o. year old male  here with:  1. Obstructive sleep apnea on CPAP, he was morbidly obese - he is now slim.  2. Epilepsy/ Seizures -in 2016 he developed abnormal liver function tests, elevated transaminases and had to be switched from Depakote to Tegretol and is also on  Seroquel's generic form 400 mg 2 pills daily at 8 PM he takes fluoxetine the generic Prozac 20 mg 3 pills daily, diltiazem for blood pressure 3 and 60 mg daily, desmopressin 0.2 mg 2 pills at night controlling nocturia.  Clonazepam 1 mg 3 times a day, carbamazepine extended release 200 mg twice a day ,Lyrica 75 daily. . 3. Mental retardation, developmental delay 4. Right hemiparesis, congenital- suspect cerebral palsy - has had large ventriculomegaly , known for over 20 years.    He will continue on Lyrica 75 mg daily for seizures/ behavior .    Will follow-up in one year or sooner if needed with NP. Please review my physical exam, eye exam and ataxia, hemiparesis in the setting of cerebral palsy.   Arlene has been compliant with his use of CPAP he is actually my posterior child.  93% CPAP compliance with an average user time of 10 hours 52 minutes, the AutoSet is between 5 and 15 cmH2O pressure window and an expiratory pressure relief of 2 cmH2O.  Residual apnea index is 4.7, which is a good resolution.  There are some air leaks which are expected in a patient with facial asymmetry, macroglossia, retrognathia.  The 95th percentile pressure is at 11.2 cmH2O and is well covered in the current settings.  He endorsed the Epworth sleepiness score at 8 points - 16 points-given that he does not drive and does not drink alcohol.    Melvyn Novas, MD  05/04/2018, 2:42 PM [facility] [address], [address] [place], [place] [address] [phone]

## 2018-05-04 NOTE — Telephone Encounter (Signed)
R/c medical records on 05/04/18.

## 2018-07-22 ENCOUNTER — Ambulatory Visit: Payer: Medicare Other | Admitting: Neurology

## 2018-10-19 ENCOUNTER — Telehealth: Payer: Self-pay | Admitting: Neurology

## 2018-10-19 DIAGNOSIS — G802 Spastic hemiplegic cerebral palsy: Secondary | ICD-10-CM

## 2018-10-19 DIAGNOSIS — R569 Unspecified convulsions: Secondary | ICD-10-CM

## 2018-10-19 DIAGNOSIS — Z9181 History of falling: Secondary | ICD-10-CM

## 2018-10-19 DIAGNOSIS — G808 Other cerebral palsy: Secondary | ICD-10-CM

## 2018-10-19 DIAGNOSIS — I69351 Hemiplegia and hemiparesis following cerebral infarction affecting right dominant side: Secondary | ICD-10-CM

## 2018-10-19 NOTE — Telephone Encounter (Signed)
Are we talking about a helmet or halo for brain/ skull protection?  That  would be like a bicyclist helmet, protecting but light weight. 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    I recommend a helmet.  Is the [facility] Physician involved in any way ?

## 2018-10-19 NOTE — Telephone Encounter (Signed)
Pts caregiver Lenny Pastel is calling in wanting to know if there is a special type of headgear that can be provided for patient. He is on eliquis now due to a blood clot and he was told if he falls from dragging his leg due to cerebral palsy he can get a brain bleed and hes very concerned.  CB# [phone]

## 2018-10-20 NOTE — Telephone Encounter (Signed)
Called the patient care taker. Patient states that the PCP is involved and aware but in order for insurance to cover the cost a neurologist has to order. Pt's caregiver indicated that since he is established with Mario Willis and she has in past reviewed his scans just through his follow up appointments, his PCP was suppose to be placing a referral to neurologist but then covid happened and so havent been able to follow up on that matter. Caregiver states since he is established with Mario Willis he just thought it was worth mentioning and seeing if she is willing. I advised the patient's caretaker she does recommend a helmet to help with the protection. I advised I would mention this to Mario Willis and I will see if she is willing for me to place the order. Informed I will then send the order to a durable medical equipment company that would have to schedule an apt to have him fitted for the helmet. Caretaker verbalized understanding of the process. He states DSS/insurance requires a letter stating that Mario Willis recommends this and its for brain/skull protection to prevent brain bleed.  I informed him ill run all this through Mario Willis and if she agrees and is ok with this I will get it taken care of today for him. Informed him I would call if she didn't want to move forward with this. Patient's caretaker verbalized understanding and was appreciative. Asked for the letter to be faxed and attention to him at [phone].

## 2018-11-04 NOTE — Telephone Encounter (Signed)
Received a notification from [employer],  "I do not think that is not something [employer] carries. However, I can suggest from personal experience using [employer] or [employer] out of [place]. They cover this area and deal in all kinds of special needs equipment."  I will fax orders over to [employer] and try through them.

## 2018-11-11 ENCOUNTER — Other Ambulatory Visit: Payer: Self-pay | Admitting: Neurology

## 2018-11-29 NOTE — Telephone Encounter (Signed)
[facility] taker of pt) has asked that this be sent as high priority as he has not been contacted by RN re: the helmet which will greatly benefit pt.  Mario Willis is asking for a call from RN as soon as she is available.

## 2018-11-30 NOTE — Telephone Encounter (Signed)
Called the patient's care taker. I had previously mailed out letter and a order for the patient in July. They did not receive that. I have advised I will resend that as well as placing a note with a couple of other companies that may get the helmet for the pt. I have reached out to [employer] and [employer] and neither offer those as a option. Caretaker was appreciative and verbalized understanding.

## 2019-02-14 ENCOUNTER — Other Ambulatory Visit: Payer: Self-pay | Admitting: Neurology

## 2019-05-11 ENCOUNTER — Ambulatory Visit: Payer: Medicare Other | Admitting: Adult Health

## 2019-08-15 ENCOUNTER — Other Ambulatory Visit: Payer: Self-pay | Admitting: Neurology

## 2019-10-27 ENCOUNTER — Telehealth: Payer: Self-pay | Admitting: Neurology

## 2019-10-27 NOTE — Telephone Encounter (Signed)
Patient's emergency contact (Adam) called and states that patient's health is declining and he is becoming more of a fall risk. He has not been here in over a year and does not have an appointment scheduled. He asked for a call specifically from Dr. Vickey Huger. I advised that Dr. Vickey Huger is out and he will most likely get a call from RN. He would like to discuss patient's care.

## 2019-10-31 NOTE — Telephone Encounter (Signed)
Called Mario Willis back. The patient is having some increase in falls and they were reaching out about the helmet concerns from what we discussed. He states that they have already looked into getting one and should be good to go. He had no other questions or concerns to pass along to Dr Vickey Huger. Advised him to reach out if needed [place] for anything.

## 2019-11-01 NOTE — Telephone Encounter (Signed)
Lets make a revisit appointment in the next 2 month

## 2020-02-13 ENCOUNTER — Other Ambulatory Visit: Payer: Self-pay | Admitting: Neurology

## 2020-02-22 ENCOUNTER — Telehealth: Payer: Self-pay | Admitting: Neurology

## 2020-02-22 NOTE — Telephone Encounter (Signed)
Pt is requesting a refill for pregabalin (LYRICA) 75 MG capsule.  Pharmacy:  [facility]

## 2020-02-22 NOTE — Telephone Encounter (Signed)
Called the caregiver and advised that since it has been 04/2018 since we have seen him we would really need to see him sooner than the march apt that was scheduled. I was able to send a mychart act link for them to activate to be able to complete mychart act for him. Advised them how to access the apt to complete a video visit. Advised I would send a refill for the month supply of the medication until the video visit is available.

## 2020-02-27 ENCOUNTER — Other Ambulatory Visit: Payer: Self-pay | Admitting: Neurology

## 2020-03-13 ENCOUNTER — Telehealth (INDEPENDENT_AMBULATORY_CARE_PROVIDER_SITE_OTHER): Payer: Medicare Other | Admitting: Adult Health

## 2020-03-13 DIAGNOSIS — G40909 Epilepsy, unspecified, not intractable, without status epilepticus: Secondary | ICD-10-CM

## 2020-03-13 DIAGNOSIS — G4733 Obstructive sleep apnea (adult) (pediatric): Secondary | ICD-10-CM | POA: Diagnosis not present

## 2020-03-13 DIAGNOSIS — Z9989 Dependence on other enabling machines and devices: Secondary | ICD-10-CM

## 2020-03-13 DIAGNOSIS — R569 Unspecified convulsions: Secondary | ICD-10-CM

## 2020-03-13 NOTE — Progress Notes (Signed)
PATIENT: Mario Willis DOB: Sep 05, 1975  REASON FOR VISIT: follow up HISTORY FROM: patient  Virtual Visit via Video Note  I connected with [name] on 03/13/20 at  8:00 AM EST by a video enabled telemedicine application located remotely at [facility] and verified that I am speaking with the correct person using two identifiers who was located at their own home.   I discussed the limitations of evaluation and management by telemedicine and the availability of in person appointments. The patient expressed understanding and agreed to proceed.   PATIENT: Mario Willis DOB: 1975/10/08  REASON FOR VISIT: follow up HISTORY FROM: patient  HISTORY OF PRESENT ILLNESS: Today 03/13/20:  Mario Willis is a 44 year old male with a history of obstructive sleep apnea on CPAP, seizures and cognitive delay.  He returns today for follow-up.  His CPAP download indicates that he uses machine 25 out of 30 days for compliance of 83%.  He uses machine greater than 4 hours each night.  On average he uses his machine 11 hours and 28 minutes.  His residual AHI is 6.6 on 5 to 15 cm of water with EPR 2.  Leak in the 95th percentile is 58 L/min.  His caregiver reports that if he gets up and goes to the bathroom during the night he sometimes has trouble getting the mask back on correctly.  Patient denies any seizure events.  He continues on carbamazepine and Lamictal.  He recently had blood work.  Caregiver will have that sent to our office.  He lives in a group home.  Caregiver reports that he had a pulmonary embolism back in August and is now on a blood thinner.  He returns today for an evaluation  HISTORY 05-04-2018, RV. Mario Willis is seen here today as admitted by 44 year old African-American gentleman with a history of hemiparesis, spasticity, and he used to be morbidly obese but I followed him initially but lost a lot of weight.  However he is still CPAP dependent has  obstructive sleep apneas and a history of seizures, of which none have been seen in over a decade!  He has also a malignant thickened cognitive delay, visual acuity loss and lives with 2 other charges in a private  [facility] home.   Prior to Christmas he had fallen at the day program, riding an escalator. He was bleeding. He  was seen by a new PA, Darryl Lent and she ordered a CT but did not include the scan into her referral notes.  REVIEW OF SYSTEMS: Out of a complete 14 system review of symptoms, the patient complains only of the following symptoms, and all other reviewed systems are negative.  ALLERGIES: Allergies  Allergen Reactions  . Gabapentin     HOME MEDICATIONS: Outpatient Medications Prior to Visit  Medication Sig Dispense Refill  . Asenapine Maleate 2.5 MG SUBL Place 5 mg under the tongue.    . benztropine (COGENTIN) 0.5 MG tablet Take 0.5 mg by mouth 2 (two) times daily.    . carbamazepine (TEGRETOL) 100 MG chewable tablet Chew 2 tablets (200 mg total) by mouth 2 (two) times daily. 60 tablet 11  . cetirizine (ZYRTEC) 10 MG tablet Take 10 mg by mouth.    . cetirizine (ZYRTEC) 10 MG tablet Take 10 mg by mouth daily.    . clonazePAM (KLONOPIN) 1 MG tablet Take 1 mg by mouth 3 (three) times daily as needed for anxiety.    Marland Kitchen desmopressin (DDAVP) 0.2  MG tablet Take 0.2 mg by mouth daily. At night for insipidus.    Marland Kitchen diltiazem (DILACOR XR) 240 MG 24 hr capsule Take 1 capsule (240 mg total) by mouth daily. (Patient taking differently: Take 360 mg by mouth daily. ) 30 capsule 11  . diltiazem (TIAZAC) 360 MG 24 hr capsule TAKE ONE CAPSULE BY MOUTH DAILY AT 8AM    . docusate sodium (STOOL SOFTENER) 100 MG capsule 60 mg 2 (two) times daily. 1 pill 2 x a day    . ferrous sulfate 325 (65 FE) MG tablet Take 325 mg by mouth daily with breakfast.    . FLUoxetine (PROZAC) 20 MG capsule Take 20 mg by mouth at bedtime.     . IRON PO Take by mouth daily.    . montelukast (SINGULAIR) 10 MG tablet Take  10 mg by mouth every morning. 1 pill in the am    . polyethylene glycol (MIRALAX / GLYCOLAX) packet Take 17 g by mouth daily as needed.    . pregabalin (LYRICA) 75 MG capsule TAKE ONE CAPSULE BY MOUTH DAILY 30 capsule 0  . QUEtiapine (SEROQUEL XR) 400 MG 24 hr tablet Take 800 mg by mouth at bedtime.    Marland Kitchen QUEtiapine (SEROQUEL) 300 MG tablet Take 300 mg by mouth at bedtime. 2 at 8 Pm po.     No facility-administered medications prior to visit.    PAST MEDICAL HISTORY: Past Medical History:  Diagnosis Date  . Anxiety   . Cerebral palsy, hemiplegic (HCC) 02/01/2013  . Confusion    since 7-8 mo ago.  . Depression   . Development delay 02/01/2013  . Developmental delay   . Epileptic seizures (HCC) 02/01/2013  . Falls    increased falls since 7-8 mo ago  . Mental disability    childhood  . Obesity   . Seizures (HCC)   . Sleep apnea   . Sleep apnea with use of continuous positive airway pressure (CPAP) 02/01/2013   Severe apnea with asv , IPAP and EPAP  Not set, auto function.   . Vision abnormalities     PAST SURGICAL HISTORY: Past Surgical History:  Procedure Laterality Date  . COLONOSCOPY      FAMILY HISTORY: No family history on file.  SOCIAL HISTORY: Social History   Socioeconomic History  . Marital status: Single    Spouse name: Not on file  . Number of children: Not on file  . Years of education: Not on file  . Highest education level: Not on file  Occupational History  . Not on file  Tobacco Use  . Smoking status: Never Smoker  . Smokeless tobacco: Never Used  Substance and Sexual Activity  . Alcohol use: No  . Drug use: No  . Sexual activity: Never  Other Topics Concern  . Not on file  Social History Narrative   Left handed, NO caffeine, Alternative Family Living.  Works at a shop (skills).  (non pay).    Social Determinants of Health   Financial Resource Strain:   . Difficulty of Paying Living Expenses: Not on file  Food Insecurity:   . Worried  About Programme researcher, broadcasting/film/video in the Last Year: Not on file  . Ran Out of Food in the Last Year: Not on file  Transportation Needs:   . Lack of Transportation (Medical): Not on file  . Lack of Transportation (Non-Medical): Not on file  Physical Activity:   . Days of Exercise per Week: Not on file  .  Minutes of Exercise per Session: Not on file  Stress:   . Feeling of Stress : Not on file  Social Connections:   . Frequency of Communication with Friends and Family: Not on file  . Frequency of Social Gatherings with Friends and Family: Not on file  . Attends Religious Services: Not on file  . Active Member of Clubs or Organizations: Not on file  . Attends Banker Meetings: Not on file  . Marital Status: Not on file  Intimate Partner Violence:   . Fear of Current or Ex-Partner: Not on file  . Emotionally Abused: Not on file  . Physically Abused: Not on file  . Sexually Abused: Not on file      PHYSICAL EXAM Generalized: Well developed, in no acute distress   Neurological examination  Mentation: Alert and oriented to person and place. Follows all commands speech and language fluent Cranial nerve II-XII:Extraocular movements were full. Facial symmetry noted. uvula tongue midline. Head turning and shoulder shrug  were normal and symmetric. Motor: Good strength throughout subjectively per patient Sensory: Sensory testing is intact to soft touch on all 4 extremities subjectively per patient Gait and station: Patient is able to stand from a seated position. gait is normal.  Reflexes: UTA  DIAGNOSTIC DATA (LABS, IMAGING, TESTING) - I reviewed patient records, labs, notes, testing and imaging myself where available.     ASSESSMENT AND PLAN 44 y.o. year old male  has a past medical history of Anxiety, Cerebral palsy, hemiplegic (HCC) (02/01/2013), Confusion, Depression, Development delay (02/01/2013), Developmental delay, Epileptic seizures (HCC) (02/01/2013), Falls, Mental  disability, Obesity, Seizures (HCC), Sleep apnea, Sleep apnea with use of continuous positive airway pressure (CPAP) (02/01/2013), and Vision abnormalities. here with:  1.  Obstructive sleep apnea on CPAP  -Good compliance -Good treatment of apnea -Encouraged patient to use CPAP nightly and greater than 4 hours each night  2.  Seizures  -Continue carbamazepine 200 mg twice a day -Continue Lyrica 75 mg daily -Recently had blood work.  I requested that the caregiver have the results sent to our office  I spent 20 minutes of face-to-face and non-face-to-face time with patient.  This included previsit chart review, lab review, study review, order entry, electronic health record documentation, patient education.    Butch Penny, MSN, NP-C 03/13/2020, 8:19 AM [facility] [address], [address] [place], [place] [address] [phone]

## 2020-03-14 ENCOUNTER — Other Ambulatory Visit: Payer: Self-pay | Admitting: Adult Health

## 2020-03-14 MED ORDER — PREGABALIN 75 MG PO CAPS: 75.0000 mg | ORAL_CAPSULE | Freq: Every day | ORAL | 1 refills | Status: DC

## 2020-03-14 NOTE — Telephone Encounter (Signed)
Pt is needing a refill on his pregabalin (LYRICA) 75 MG capsule sent in to the [facility]

## 2020-07-04 ENCOUNTER — Ambulatory Visit (INDEPENDENT_AMBULATORY_CARE_PROVIDER_SITE_OTHER): Payer: Medicare Other | Admitting: Adult Health

## 2020-07-04 VITALS — BP 123/74 | HR 76 | Ht 73.0 in | Wt 200.0 lb

## 2020-07-04 DIAGNOSIS — Z9989 Dependence on other enabling machines and devices: Secondary | ICD-10-CM | POA: Diagnosis not present

## 2020-07-04 DIAGNOSIS — G4733 Obstructive sleep apnea (adult) (pediatric): Secondary | ICD-10-CM

## 2020-07-04 DIAGNOSIS — R569 Unspecified convulsions: Secondary | ICD-10-CM | POA: Diagnosis not present

## 2020-07-04 NOTE — Patient Instructions (Signed)
Continue carbamazepine and Lyrica Blood work today If you have any seizure events please let us know.  Continue using CPAP nightly

## 2020-07-04 NOTE — Progress Notes (Signed)
PATIENT: Mario Willis DOB: 22-Sep-1975  REASON FOR VISIT: follow up HISTORY FROM: patient  HISTORY OF PRESENT ILLNESS: Today 07/04/20:  Mario Willis is a 45 year old male with a history of obstructive sleep apnea on CPAP, seizures and cognitive delay.  He returns today for follow-up.  His CPAP download indicates that he use his machine 23 out of 30 days for compliance of 77%.  He uses machine greater than 4 hours each night.  On average he uses his machine 11 hours and 55 minutes.  His residual AHI is 6.6 on 5 to 15 cm of water with EPR 2.  Leak in the 95th percentile is 47.2 L/min.  Patient remains on carbamazepine 200 mg twice a day (prescribed by psychiatrist) and Lyrica 75 mg daily.  He denies any seizure events.  Reports that he tolerates the medication well.  Returns today for an evaluation  HISTORY: 03/13/20:   Mario Willis is a 45 year old male with a history of obstructive sleep apnea on CPAP, seizures and cognitive delay.  He returns today for follow-up.   His CPAP download indicates that he uses machine 25 out of 30 days for compliance of 83%.  He uses machine greater than 4 hours each night.  On average he uses his machine 11 hours and 28 minutes.  His residual AHI is 6.6 on 5 to 15 cm of water with EPR 2.  Leak in the 95th percentile is 58 L/min.  His caregiver reports that if he gets up and goes to the bathroom during the night he sometimes has trouble getting the mask back on correctly.   Patient denies any seizure events.  He continues on carbamazepine and Lyrica He recently had blood work.  Caregiver will have that sent to our office.  He lives in a group home.  Caregiver reports that he had a pulmonary embolism back in August and is now on a blood thinner.  He returns today for an evaluation   REVIEW OF SYSTEMS: Out of a complete 14 system review of symptoms, the patient complains only of the following symptoms, and all other reviewed systems are negative.  See  HPI  ALLERGIES: Allergies  Allergen Reactions  . Gabapentin     HOME MEDICATIONS: Outpatient Medications Prior to Visit  Medication Sig Dispense Refill  . APIXABAN (ELIQUIS) VTE STARTER PACK (10MG  AND 5MG ) Take by mouth.    . Asenapine Maleate 2.5 MG SUBL Place 5 mg under the tongue.    . benztropine (COGENTIN) 0.5 MG tablet Take 0.5 mg by mouth 2 (two) times daily.    . carbamazepine (TEGRETOL) 100 MG chewable tablet Chew 2 tablets (200 mg total) by mouth 2 (two) times daily. 60 tablet 11  . cetirizine (ZYRTEC) 10 MG tablet Take 10 mg by mouth daily.    . clonazePAM (KLONOPIN) 1 MG tablet Take 1 mg by mouth 3 (three) times daily as needed for anxiety.    desmopressin (DDAVP) 0.2 MG tablet Take 0.2 mg by mouth daily. At night for insipidus.    diltiazem (DILACOR XR) 240 MG 24 hr capsule Take 1 capsule (240 mg total) by mouth daily. (Patient taking differently: Take 360 mg by mouth daily.) 30 capsule 11  . diltiazem (TIAZAC) 360 MG 24 hr capsule TAKE ONE CAPSULE BY MOUTH DAILY AT 8AM    . docusate sodium (COLACE) 100 MG capsule 60 mg 2 (two) times daily. 1 pill 2 x a day    . ferrous sulfate 325 (  65 FE) MG tablet Take 325 mg by mouth daily with breakfast.    . FLUoxetine (PROZAC) 20 MG capsule Take 20 mg by mouth at bedtime.     . IRON PO Take by mouth daily.    . montelukast (SINGULAIR) 10 MG tablet Take 10 mg by mouth every morning. 1 pill in the am    . polyethylene glycol (MIRALAX / GLYCOLAX) packet Take 17 g by mouth daily as needed.    . pregabalin (LYRICA) 75 MG capsule Take 1 capsule (75 mg total) by mouth daily. 90 capsule 1  . QUEtiapine (SEROQUEL XR) 400 MG 24 hr tablet Take 800 mg by mouth at bedtime.    Marland Kitchen QUEtiapine (SEROQUEL) 300 MG tablet Take 300 mg by mouth at bedtime. 2 at 8 Pm po.    . cetirizine (ZYRTEC) 10 MG tablet Take 10 mg by mouth.     No facility-administered medications prior to visit.    PAST MEDICAL HISTORY: Past Medical History:  Diagnosis Date   . Anxiety   . Cerebral palsy, hemiplegic (HCC) 02/01/2013  . Confusion    since 7-8 mo ago.  . Depression   . Development delay 02/01/2013  . Developmental delay   . Epileptic seizures (HCC) 02/01/2013  . Falls    increased falls since 7-8 mo ago  . Mental disability    childhood  . Obesity   . Seizures (HCC)   . Sleep apnea   . Sleep apnea with use of continuous positive airway pressure (CPAP) 02/01/2013   Severe apnea with asv , IPAP and EPAP  Not set, auto function.   . Vision abnormalities     PAST SURGICAL HISTORY: Past Surgical History:  Procedure Laterality Date  . COLONOSCOPY      FAMILY HISTORY: No family history on file.  SOCIAL HISTORY: Social History   Socioeconomic History  . Marital status: Single    Spouse name: Not on file  . Number of children: Not on file  . Years of education: Not on file  . Highest education level: Not on file  Occupational History  . Not on file  Tobacco Use  . Smoking status: Never Smoker  . Smokeless tobacco: Never Used  Substance and Sexual Activity  . Alcohol use: No  . Drug use: No  . Sexual activity: Never  Other Topics Concern  . Not on file  Social History Narrative   Left handed, NO caffeine, Alternative Family Living.  Works at a shop (skills).  (non pay).    Social Determinants of Health   Financial Resource Strain: Not on file  Food Insecurity: Not on file  Transportation Needs: Not on file  Physical Activity: Not on file  Stress: Not on file  Social Connections: Not on file  Intimate Partner Violence: Not on file      PHYSICAL EXAM  Vitals:   07/04/20 1331  BP: 123/74  Pulse: 76  Weight: 200 lb (90.7 kg)  Height: 6\' 1"  (1.854 m)   Body mass index is 26.39 kg/m.  Generalized: Well developed, in no acute distress   Neurological examination  Mentation: Alert oriented to person and place. Follows all commands  Cranial nerve II-XII: Pupils were equal round reactive to light. Extraocular  movements were full, visual field were full on confrontational test. Facial sensation and strength were normal. Uvula tongue midline. Head turning and shoulder shrug  were normal and symmetric. Motor: The motor testing reveals 5 over 5 strength in all extremities with the  exception of right lower extremity 4/5 strength.  Foot drop noted on the right limited movement of the left hand Sensory: Sensory testing is intact to soft touch on all 4 extremities. No evidence of extinction is noted.  Coordination: Good finger-nose-finger on the left unable to do on the right.  Heel-to-shin good on the right and able to complete on the left Gait and station: Spastic gait noted slightly unsteady.   DIAGNOSTIC DATA (LABS, IMAGING, TESTING) - I reviewed patient records, labs, notes, testing and imaging myself where available.     ASSESSMENT AND PLAN 45 y.o. year old male  has a past medical history of Anxiety, Cerebral palsy, hemiplegic (HCC) (02/01/2013), Confusion, Depression, Development delay (02/01/2013), Developmental delay, Epileptic seizures (HCC) (02/01/2013), Falls, Mental disability, Obesity, Seizures (HCC), Sleep apnea, Sleep apnea with use of continuous positive airway pressure (CPAP) (02/01/2013), and Vision abnormalities. here with:  1.  Seizures  --Continue carbamazepine 200 mg twice a day currently prescribed by psychiatrist --Continue Lyrica 75 mg daily --Blood work today  2.  Obstructive sleep apnea on CPAP  --Good compliance -Residual AHI normal range --Encouraged to continue using CPAP nightly and greater than 4 hours each night  I spent 30 minutes of face-to-face and non-face-to-face time with patient.  This included previsit chart review, lab review, study review, order entry, electronic health record documentation, patient education.  Butch Penny, MSN, NP-C 07/04/2020, 1:38 PM [facility] [address] [place], [place] [address] [phone]

## 2020-07-05 LAB — CARBAMAZEPINE LEVEL, TOTAL: Carbamazepine (Tegretol), S: 12.4 ug/mL (ref 4.0–12.0)

## 2020-08-23 ENCOUNTER — Other Ambulatory Visit: Payer: Self-pay | Admitting: Adult Health

## 2021-02-11 ENCOUNTER — Telehealth: Payer: Self-pay | Admitting: Adult Health

## 2021-02-11 NOTE — Telephone Encounter (Signed)
..   Pt understands that although there may be some limitations with this type of visit, we will take all precautions to reduce any security or privacy concerns.  Pt understands that this will be treated like an in office visit and we will file with pt's insurance, and there may be a patient responsible charge related to this service. ? ?

## 2021-03-12 NOTE — Progress Notes (Signed)
PATIENT: Mario Willis DOB: 1975-12-24  REASON FOR VISIT: follow up HISTORY FROM: patient PRIMARY NEUROLOGIST:   Virtual Visit via Video Note  I connected with Mario Willis on 03/13/21 at 11:30 AM EST by a video enabled telemedicine application located remotely at [facility] and verified that I am speaking with the correct person using two identifiers who was located at their own home.   I discussed the limitations of evaluation and management by telemedicine and the availability of in person appointments. The patient expressed understanding and agreed to proceed.   PATIENT: Mario Willis DOB: [birth year]  REASON FOR VISIT: follow up HISTORY FROM: patient  HISTORY OF PRESENT ILLNESS: Today 03/13/21 Mario Willis is a 45 year old male with a history of obstructive sleep apnea on CPAP and seizures.  He returns today for follow-up.  He is currently doing well with CPAP.  Occasionally he will get up during the night to use the bathroom and has some difficulty putting the mask back on appropriately.  And he does leak.  Caregiver states that he has not had any seizure events.  He continues on carbamazepine 200 mg twice a day and Lyrica 75 mg daily.  He returns today for evaluation.   07/04/20: Mario Willis is a 45 year old male with a history of obstructive sleep apnea on CPAP, seizures and cognitive delay.  He returns today for follow-up.   His CPAP download indicates that he uses machine 25 out of 30 days for compliance of 83%.  He uses machine greater than 4 hours each night.  On average he uses his machine 11 hours and 28 minutes.  His residual AHI is 6.6 on 5 to 15 cm of water with EPR 2.  Leak in the 95th percentile is 58 L/min.  His caregiver reports that if he gets up and goes to the bathroom during the night he sometimes has trouble getting the mask back on correctly.   Patient denies any seizure events.  He continues on carbamazepine and lyrica.  He  recently had blood work.  Caregiver will have that sent to our office.  He lives in a group home.  Caregiver reports that he had a pulmonary embolism back in August and is now on a blood thinner.  He returns today for an evaluation   REVIEW OF SYSTEMS: Out of a complete 14 system review of symptoms, the patient complains only of the following symptoms, and all other reviewed systems are negative.  ALLERGIES: Allergies  Allergen Reactions   Gabapentin     HOME MEDICATIONS: Outpatient Medications Prior to Visit  Medication Sig Dispense Refill   APIXABAN (ELIQUIS) VTE STARTER PACK (10MG  AND 5MG ) Take by mouth.     Asenapine Maleate 2.5 MG SUBL Place 5 mg under the tongue.     benztropine (COGENTIN) 0.5 MG tablet Take 0.5 mg by mouth 2 (two) times daily.     carbamazepine (TEGRETOL) 100 MG chewable tablet Chew 2 tablets (200 mg total) by mouth 2 (two) times daily. 60 tablet 11   cetirizine (ZYRTEC) 10 MG tablet Take 10 mg by mouth.     cetirizine (ZYRTEC) 10 MG tablet Take 10 mg by mouth daily.     clonazePAM (KLONOPIN) 1 MG tablet Take 1 mg by mouth 3 (three) times daily as needed for anxiety.     desmopressin (DDAVP) 0.2 MG tablet Take 0.2 mg by mouth daily. At night for insipidus.     diltiazem (DILACOR XR) 240 MG  24 hr capsule Take 1 capsule (240 mg total) by mouth daily. (Patient taking differently: Take 360 mg by mouth daily.) 30 capsule 11   diltiazem (TIAZAC) 360 MG 24 hr capsule TAKE ONE CAPSULE BY MOUTH DAILY AT 8AM     docusate sodium (COLACE) 100 MG capsule 60 mg 2 (two) times daily. 1 pill 2 x a day     ferrous sulfate 325 (65 FE) MG tablet Take 325 mg by mouth daily with breakfast.     FLUoxetine (PROZAC) 20 MG capsule Take 20 mg by mouth at bedtime.      IRON PO Take by mouth daily.     montelukast (SINGULAIR) 10 MG tablet Take 10 mg by mouth every morning. 1 pill in the am     polyethylene glycol (MIRALAX / GLYCOLAX) packet Take 17 g by mouth daily as needed.     pregabalin  (LYRICA) 75 MG capsule TAKE ONE CAPSULE BY MOUTH DAILY 90 capsule 1   QUEtiapine (SEROQUEL XR) 400 MG 24 hr tablet Take 800 mg by mouth at bedtime.     QUEtiapine (SEROQUEL) 300 MG tablet Take 300 mg by mouth at bedtime. 2 at 8 Pm po.     No facility-administered medications prior to visit.    PAST MEDICAL HISTORY: Past Medical History:  Diagnosis Date   Anxiety    Cerebral palsy, hemiplegic (HCC) 02/01/2013   Confusion    since 7-8 mo ago.   Depression    Development delay 02/01/2013   Developmental delay    Epileptic seizures (HCC) 02/01/2013   Falls    increased falls since 7-8 mo ago   Mental disability    childhood   Obesity    Seizures (HCC)    Sleep apnea    Sleep apnea with use of continuous positive airway pressure (CPAP) 02/01/2013   Severe apnea with asv , IPAP and EPAP  Not set, auto function.    Vision abnormalities     PAST SURGICAL HISTORY: Past Surgical History:  Procedure Laterality Date   COLONOSCOPY      FAMILY HISTORY: No family history on file.  SOCIAL HISTORY: Social History   Socioeconomic History   Marital status: Single    Spouse name: Not on file   Number of children: Not on file   Years of education: Not on file   Highest education level: Not on file  Occupational History   Not on file  Tobacco Use   Smoking status: Never   Smokeless tobacco: Never  Substance and Sexual Activity   Alcohol use: No   Drug use: No   Sexual activity: Never  Other Topics Concern   Not on file  Social History Narrative   Left handed, NO caffeine, [employer].  Works at a shop (skills).  (non pay).    Social Determinants of Health   Financial Resource Strain: Not on file  Food Insecurity: Not on file  Transportation Needs: Not on file  Physical Activity: Not on file  Stress: Not on file  Social Connections: Not on file  Intimate Partner Violence: Not on file      PHYSICAL EXAM Generalized: Well developed, in no acute  distress   Neurological examination  Mentation: Alert oriented to time, place, history taking. Follows all commands speech and language fluent Cranial nerve II-XII:Extraocular movements were full. Facial symmetry noted. uvula tongue midline. Head turning and shoulder shrug  were normal and symmetric. Motor: Good strength throughout subjectively per patient Sensory: Sensory testing  is intact to soft touch on all 4 extremities subjectively per patient Coordination: Cerebellar testing reveals good finger-nose-finger  Gait and station: Patient is able to stand from a seated position. gait is normal.  Reflexes: UTA  DIAGNOSTIC DATA (LABS, IMAGING, TESTING) - I reviewed patient records, labs, notes, testing and imaging myself where available.      ASSESSMENT AND PLAN 45 y.o. year old male  has a past medical history of Anxiety, Cerebral palsy, hemiplegic (HCC) (02/01/2013), Confusion, Depression, Development delay (02/01/2013), Developmental delay, Epileptic seizures (HCC) (02/01/2013), Falls, Mental disability, Obesity, Seizures (HCC), Sleep apnea, Sleep apnea with use of continuous positive airway pressure (CPAP) (02/01/2013), and Vision abnormalities. here with:  OSA on CPAP  CPAP compliance excellent Residual AHI is good Encouraged patient to continue using CPAP nightly and > 4 hours each night  Seizures  Continue Lyrica 75 mg daily Continue carbamazepine 200 mg twice a day He has blood work through his PCP F/U in 1 year or sooner if needed- IN OFFICE    Butch Penny, MSN, NP-C 03/13/2021, 11:28 AM [facility] [address], [address] [place], [place] [address] [phone]

## 2021-03-13 ENCOUNTER — Telehealth (INDEPENDENT_AMBULATORY_CARE_PROVIDER_SITE_OTHER): Payer: Medicare Other | Admitting: Adult Health

## 2021-03-13 ENCOUNTER — Telehealth: Payer: Self-pay | Admitting: Adult Health

## 2021-03-13 DIAGNOSIS — G4733 Obstructive sleep apnea (adult) (pediatric): Secondary | ICD-10-CM | POA: Diagnosis not present

## 2021-03-13 DIAGNOSIS — Z9989 Dependence on other enabling machines and devices: Secondary | ICD-10-CM | POA: Diagnosis not present

## 2021-03-13 DIAGNOSIS — R569 Unspecified convulsions: Secondary | ICD-10-CM

## 2021-03-13 NOTE — Telephone Encounter (Signed)
Call pt to remind of MyChart Video visit for today.

## 2021-04-09 ENCOUNTER — Other Ambulatory Visit: Payer: Self-pay | Admitting: Adult Health

## 2021-04-10 NOTE — Telephone Encounter (Signed)
Per [facility] registry, last filled on 12/12/2020 Pregabalin 75 Mg Capsule #90/90. Rx refill sent to MM NP. Next appt is on 03/17/22.

## 2021-04-15 ENCOUNTER — Telehealth: Payer: Self-pay | Admitting: Adult Health

## 2021-04-15 NOTE — Telephone Encounter (Signed)
Pt's friend Madelaine Bhat (on [place]) called requesting refill for pregabalin (LYRICA) 75 MG capsule. Pharmacy [facility] - [place], [place] - [address].

## 2021-04-15 NOTE — Telephone Encounter (Signed)
This was done in rx refills.

## 2022-03-13 ENCOUNTER — Encounter: Payer: Self-pay | Admitting: *Deleted

## 2022-03-17 ENCOUNTER — Telehealth: Payer: Self-pay | Admitting: Adult Health

## 2022-03-17 ENCOUNTER — Telehealth: Payer: Medicare Other | Admitting: Adult Health

## 2022-03-17 ENCOUNTER — Other Ambulatory Visit: Payer: Self-pay | Admitting: Adult Health

## 2022-03-17 NOTE — Progress Notes (Unsigned)
PATIENT: Mario Willis DOB: [birth year]  REASON FOR VISIT: follow up HISTORY FROM: patient PRIMARY NEUROLOGIST: Dr. Vickey Huger  Virtual Visit via Video Note  I connected with Mario Willis on 03/18/22 at  8:30 AM EST by a video enabled telemedicine application located remotely at [facility] and verified that I am speaking with the correct person using two identifiers who was located at their own home.   I discussed the limitations of evaluation and management by telemedicine and the availability of in person appointments. The patient expressed understanding and agreed to proceed.   PATIENT: Mario Willis DOB: Sep 08, 1975  REASON FOR VISIT: follow up HISTORY FROM: patient  HISTORY OF PRESENT ILLNESS: Today 03/18/22:  Mr. Mario Willis is a 46 year old male with a history of obstructive sleep apnea on CPAP and seizures.  He returns today for follow-up.  Today on primarily talking to the caregiver.  He reports that Mario Willis is doing well with the CPAP.  Denies any seizure events.  Remains on carbamazepine and Lyrica.  They are currently out of town in [place] as his father-in-law has passed away.   Apr 11, 2021: Mr. Mario Willis is a 46 year old male with a history of obstructive sleep apnea on CPAP and seizures.  He returns today for follow-up.  He is currently doing well with CPAP.  Occasionally he will get up during the night to use the bathroom and has some difficulty putting the mask back on appropriately.  And he does leak.  Caregiver states that he has not had any seizure events.  He continues on carbamazepine 200 mg twice a day and Lyrica 75 mg daily.  He returns today for evaluation.   07/04/20: Mr. Mario Willis is a 46 year old male with a history of obstructive sleep apnea on CPAP, seizures and cognitive delay.  He returns today for follow-up.   His CPAP download indicates that he uses machine 25 out of 30 days for compliance of 83%.  He uses machine greater than 4  hours each night.  On average he uses his machine 11 hours and 28 minutes.  His residual AHI is 6.6 on 5 to 15 cm of water with EPR 2.  Leak in the 95th percentile is 58 L/min.  His caregiver reports that if he gets up and goes to the bathroom during the night he sometimes has trouble getting the mask back on correctly.   Patient denies any seizure events.  He continues on carbamazepine and lyrica.  He recently had blood work.  Caregiver will have that sent to our office.  He lives in a group home.  Caregiver reports that he had a pulmonary embolism back in August and is now on a blood thinner.  He returns today for an evaluation   REVIEW OF SYSTEMS: Out of a complete 14 system review of symptoms, the patient complains only of the following symptoms, and all other reviewed systems are negative.  ALLERGIES: Allergies  Allergen Reactions   Gabapentin     HOME MEDICATIONS: Outpatient Medications Prior to Visit  Medication Sig Dispense Refill   APIXABAN (ELIQUIS) VTE STARTER PACK (10MG  AND 5MG ) Take by mouth.     Asenapine Maleate 2.5 MG SUBL Place 5 mg under the tongue.     benztropine (COGENTIN) 0.5 MG tablet Take 0.5 mg by mouth 2 (two) times daily.     carbamazepine (TEGRETOL) 100 MG chewable tablet Chew 2 tablets (200 mg total) by mouth 2 (two) times daily. 60 tablet 11  cetirizine (ZYRTEC) 10 MG tablet Take 10 mg by mouth.     cetirizine (ZYRTEC) 10 MG tablet Take 10 mg by mouth daily.     clonazePAM (KLONOPIN) 1 MG tablet Take 1 mg by mouth 3 (three) times daily as needed for anxiety.     desmopressin (DDAVP) 0.2 MG tablet Take 0.2 mg by mouth daily. At night for insipidus.     diltiazem (DILACOR XR) 240 MG 24 hr capsule Take 1 capsule (240 mg total) by mouth daily. (Patient taking differently: Take 360 mg by mouth daily.) 30 capsule 11   diltiazem (TIAZAC) 360 MG 24 hr capsule TAKE ONE CAPSULE BY MOUTH DAILY AT 8AM     docusate sodium (COLACE) 100 MG capsule 60 mg 2 (two) times daily.  1 pill 2 x a day     ferrous sulfate 325 (65 FE) MG tablet Take 325 mg by mouth daily with breakfast.     FLUoxetine (PROZAC) 20 MG capsule Take 20 mg by mouth at bedtime.      IRON PO Take by mouth daily.     montelukast (SINGULAIR) 10 MG tablet Take 10 mg by mouth every morning. 1 pill in the am     polyethylene glycol (MIRALAX / GLYCOLAX) packet Take 17 g by mouth daily as needed.     pregabalin (LYRICA) 75 MG capsule Take 1 capsule (75 mg total) by mouth daily. 90 capsule 1   QUEtiapine (SEROQUEL XR) 400 MG 24 hr tablet Take 800 mg by mouth at bedtime.     QUEtiapine (SEROQUEL) 300 MG tablet Take 300 mg by mouth at bedtime. 2 at 8 Pm po.     No facility-administered medications prior to visit.    PAST MEDICAL HISTORY: Past Medical History:  Diagnosis Date   Anxiety    Cerebral palsy, hemiplegic (HCC) 02/01/2013   Confusion    since 7-8 mo ago.   Depression    Development delay 02/01/2013   Developmental delay    Epileptic seizures (HCC) 02/01/2013   Falls    increased falls since 7-8 mo ago   Mental disability    childhood   Obesity    Seizures (HCC)    Sleep apnea    Sleep apnea with use of continuous positive airway pressure (CPAP) 02/01/2013   Severe apnea with asv , IPAP and EPAP  Not set, auto function.    Vision abnormalities     PAST SURGICAL HISTORY: Past Surgical History:  Procedure Laterality Date   COLONOSCOPY      FAMILY HISTORY: No family history on file.  SOCIAL HISTORY: Social History   Socioeconomic History   Marital status: Single    Spouse name: Not on file   Number of children: Not on file   Years of education: Not on file   Highest education level: Not on file  Occupational History   Not on file  Tobacco Use   Smoking status: Never   Smokeless tobacco: Never  Substance and Sexual Activity   Alcohol use: No   Drug use: No   Sexual activity: Never  Other Topics Concern   Not on file  Social History Narrative   Left handed, NO  caffeine, [employer].  Works at a shop (skills).  (non pay).    Social Determinants of Health   Financial Resource Strain: Not on file  Food Insecurity: Not on file  Transportation Needs: Not on file  Physical Activity: Not on file  Stress: Not on file  Social Connections: Not on file  Intimate Partner Violence: Not on file        DIAGNOSTIC DATA (LABS, IMAGING, TESTING) - I reviewed patient records, labs, notes, testing and imaging myself where available.      ASSESSMENT AND PLAN 46 y.o. year old male  has a past medical history of Anxiety, Cerebral palsy, hemiplegic (HCC) (02/01/2013), Confusion, Depression, Development delay (02/01/2013), Developmental delay, Epileptic seizures (HCC) (02/01/2013), Falls, Mental disability, Obesity, Seizures (HCC), Sleep apnea, Sleep apnea with use of continuous positive airway pressure (CPAP) (02/01/2013), and Vision abnormalities. here with:  OSA on CPAP  CPAP compliance excellent Residual AHI is good   Seizures  Continue Lyrica 75 mg daily Continue carbamazepine 200 mg twice a day Order for blood work placed.  They can stop our office at their convenience and have this done F/U in 1 year or sooner if needed    Butch Penny, MSN, NP-C 03/18/2022, 7:53 AM [facility] [address], [address] [place], [place] [address] [phone]

## 2022-03-17 NOTE — Telephone Encounter (Signed)
Pt is requesting a refill for pregabalin (LYRICA) 75 MG capsule.  Pharmacy: [place], [place] [phone]

## 2022-03-17 NOTE — Telephone Encounter (Signed)
..   Pt understands that although there may be some limitations with this type of visit, we will take all precautions to reduce any security or privacy concerns.  Pt understands that this will be treated like an in office visit and we will file with pt's insurance, and there may be a patient responsible charge related to this service. ? ?

## 2022-03-18 ENCOUNTER — Telehealth (INDEPENDENT_AMBULATORY_CARE_PROVIDER_SITE_OTHER): Payer: Medicare Other | Admitting: Adult Health

## 2022-03-18 DIAGNOSIS — G4733 Obstructive sleep apnea (adult) (pediatric): Secondary | ICD-10-CM | POA: Diagnosis not present

## 2022-03-18 DIAGNOSIS — R569 Unspecified convulsions: Secondary | ICD-10-CM | POA: Diagnosis not present

## 2022-03-18 MED ORDER — PREGABALIN 75 MG PO CAPS: 75.0000 mg | ORAL_CAPSULE | Freq: Every day | ORAL | 1 refills | Status: DC

## 2022-03-24 ENCOUNTER — Other Ambulatory Visit: Payer: Self-pay | Admitting: *Deleted

## 2022-03-24 NOTE — Telephone Encounter (Signed)
Received fax from caregivers for pt, to fax order for his lyrica to [phone] [facility] 75ng 1 cap daily at 8PM  #90 and 1 refill.  Signed by MM/NP and faxed to # listed above with fax confirmation. Questions [phone]. Mario Willis and Mario Willis).

## 2022-10-16 ENCOUNTER — Other Ambulatory Visit: Payer: Self-pay | Admitting: Adult Health

## 2022-10-20 NOTE — Telephone Encounter (Signed)
Last appointment: 03/18/22 No follow up appointment scheduled.

## 2023-03-09 ENCOUNTER — Telehealth: Payer: Self-pay | Admitting: Adult Health

## 2023-03-09 NOTE — Telephone Encounter (Signed)
Called to verify appt date and time.

## 2023-05-07 ENCOUNTER — Ambulatory Visit (INDEPENDENT_AMBULATORY_CARE_PROVIDER_SITE_OTHER): Payer: Medicare Other | Admitting: Adult Health

## 2023-05-07 ENCOUNTER — Encounter: Payer: Self-pay | Admitting: Adult Health

## 2023-05-07 VITALS — BP 138/72 | HR 71 | Ht 75.0 in | Wt 209.4 lb

## 2023-05-07 DIAGNOSIS — Z5181 Encounter for therapeutic drug level monitoring: Secondary | ICD-10-CM | POA: Diagnosis not present

## 2023-05-07 DIAGNOSIS — G4733 Obstructive sleep apnea (adult) (pediatric): Secondary | ICD-10-CM

## 2023-05-07 DIAGNOSIS — R569 Unspecified convulsions: Secondary | ICD-10-CM | POA: Diagnosis not present

## 2023-05-07 NOTE — Patient Instructions (Addendum)
Your Plan:  Continue Carbamazepine 200 mg twice a day  Continue Lyrica 75 mg daily  Blood work today  If your symptoms worsen or you develop new symptoms please let us know.   Thank you for coming to see [place] at [facility]. I hope we have been able to provide you high quality care today.  You may receive a patient satisfaction survey over the next few weeks. We would appreciate your feedback and comments so that we may continue to improve ourselves and the health of our patients.

## 2023-05-07 NOTE — Progress Notes (Signed)
PATIENT: Mario Willis DOB: [birth year]  REASON FOR VISIT: follow up HISTORY FROM: patient PRIMARY NEUROLOGIST: Dr. Vickey Huger   Chief Complaint  Patient presents with   Results    Pt in 6 with caregiver Pt here for cpap/seizure f/u Caregiver and pt has no questions or concerns today caregiver states pt has an ostomy bag      HISTORY OF PRESENT ILLNESS: Today 05/07/23  Mario Willis is a 48 y.o. male who has been followed in this office for OSA on CPAP and seizures. returns today for follow-up.  Reports that the CPAP is working well for him.  He denies any seizure events.  Continues on carbamazepine 200 mg twice a day and Lyrica 75 mg daily.  Since he was last seen he now has an ostomy however they are planning an appointment at [facility] to see if this can be reversed.  Download is below     HISTORY 03/18/22:   Mario Willis is a 48 year old male with a history of obstructive sleep apnea on CPAP and seizures.  He returns today for follow-up.  Today on primarily talking to the caregiver.  He reports that [name] is doing well with the CPAP.  Denies any seizure events.  Remains on carbamazepine and Lyrica.  They are currently out of town in [place] as his father-in-law has passed away.  REVIEW OF SYSTEMS: Out of a complete 14 system review of symptoms, the patient complains only of the following symptoms, and all other reviewed systems are negative.  ALLERGIES: Allergies  Allergen Reactions   Gabapentin     HOME MEDICATIONS: Outpatient Medications Prior to Visit  Medication Sig Dispense Refill   APIXABAN (ELIQUIS) VTE STARTER PACK (10MG  AND 5MG ) Take by mouth.     Asenapine Maleate 2.5 MG SUBL Place 5 mg under the tongue.     benztropine (COGENTIN) 0.5 MG tablet Take 0.5 mg by mouth 2 (two) times daily.     carbamazepine (TEGRETOL) 100 MG chewable tablet Chew 2 tablets (200 mg total) by mouth 2 (two) times daily. 60 tablet 11   cetirizine (ZYRTEC) 10 MG tablet Take 10 mg  by mouth.     cetirizine (ZYRTEC) 10 MG tablet Take 10 mg by mouth daily.     clonazePAM (KLONOPIN) 1 MG tablet Take 1 mg by mouth 3 (three) times daily as needed for anxiety.     desmopressin (DDAVP) 0.2 MG tablet Take 0.2 mg by mouth daily. At night for insipidus.     diltiazem (DILACOR XR) 240 MG 24 hr capsule Take 1 capsule (240 mg total) by mouth daily. (Patient taking differently: Take 360 mg by mouth daily.) 30 capsule 11   diltiazem (TIAZAC) 360 MG 24 hr capsule TAKE ONE CAPSULE BY MOUTH DAILY AT 8AM     docusate sodium (COLACE) 100 MG capsule 60 mg 2 (two) times daily. 1 pill 2 x a day     ferrous sulfate 325 (65 FE) MG tablet Take 325 mg by mouth daily with breakfast.     FLUoxetine (PROZAC) 20 MG capsule Take 20 mg by mouth at bedtime.      IRON PO Take by mouth daily.     montelukast (SINGULAIR) 10 MG tablet Take 10 mg by mouth every morning. 1 pill in the am     polyethylene glycol (MIRALAX / GLYCOLAX) packet Take 17 g by mouth daily as needed.     pregabalin (LYRICA) 75 MG capsule TAKE ONE CAPSULE BY MOUTH DAILY  90 capsule 1   QUEtiapine (SEROQUEL XR) 400 MG 24 hr tablet Take 800 mg by mouth at bedtime.     QUEtiapine (SEROQUEL) 300 MG tablet Take 300 mg by mouth at bedtime. 2 at 8 Pm po.     No facility-administered medications prior to visit.    PAST MEDICAL HISTORY: Past Medical History:  Diagnosis Date   Anxiety    Cerebral palsy, hemiplegic (HCC) 02/01/2013   Confusion    since 7-8 mo ago.   Depression    Development delay 02/01/2013   Developmental delay    Epileptic seizures (HCC) 02/01/2013   Falls    increased falls since 7-8 mo ago   Mental disability    childhood   Obesity    Seizures (HCC)    Sleep apnea    Sleep apnea with use of continuous positive airway pressure (CPAP) 02/01/2013   Severe apnea with asv , IPAP and EPAP  Not set, auto function.    Vision abnormalities     PAST SURGICAL HISTORY: Past Surgical History:  Procedure Laterality Date    COLONOSCOPY      FAMILY HISTORY: No family history on file.  SOCIAL HISTORY: Social History   Socioeconomic History   Marital status: Single    Spouse name: Not on file   Number of children: Not on file   Years of education: Not on file   Highest education level: Not on file  Occupational History   Not on file  Tobacco Use   Smoking status: Never   Smokeless tobacco: Never  Substance and Sexual Activity   Alcohol use: No   Drug use: No   Sexual activity: Never  Other Topics Concern   Not on file  Social History Narrative   Left handed, NO caffeine, [employer].  Works at a shop (skills).  (non pay).    Social Drivers of Corporate investment banker Strain: Not on file  Food Insecurity: Low Risk  (12/27/2022)   Received from [facility]   Hunger Vital Sign    Worried About Running Out of Food in the Last Year: Never true    Ran Out of Food in the Last Year: Never true  Transportation Needs: No Transportation Needs (12/27/2022)   Received from [facility]    In the past 12 months, has lack of reliable transportation kept you from medical appointments, meetings, work or from getting things needed for daily living? : No  Physical Activity: Not on file  Stress: Not on file  Social Connections: Not on file  Intimate Partner Violence: Not on file      PHYSICAL EXAM  Vitals:   05/07/23 1314  BP: 138/72  Pulse: 71  Weight: 209 lb 6.4 oz (95 kg)  Height: 6\' 3"  (1.905 m)   Body mass index is 26.17 kg/m.  Neurological examination  Mentation: Alert. Follows all commands  Cranial nerve II-XII: Pupils were equal round reactive to light. Extraocular movements were full, visual field were full on confrontational test. Facial sensation and strength were normal. Uvula tongue midline. Head turning and shoulder shrug  were normal and symmetric. Motor: The motor testing reveals 5 over 5 strength in all extremities with the exception of  right lower extremity 4/5 strength and foot drop noted on the right limited movement of the right hand Sensory: Sensory testing is intact to soft touch on all 4 extremities. No evidence of extinction is noted.  Coordination: Good finger-nose-finger on the  left unable to do on the right.  Heel-to-shin good on the right and able to complete on the left Gait and station: Spastic gait noted slightly unsteady.  DIAGNOSTIC DATA (LABS, IMAGING, TESTING) - I reviewed patient records, labs, notes, testing and imaging myself where available.      ASSESSMENT AND PLAN 48 y.o. year old male  has a past medical history of Anxiety, Cerebral palsy, hemiplegic (HCC) (02/01/2013), Confusion, Depression, Development delay (02/01/2013), Developmental delay, Epileptic seizures (HCC) (02/01/2013), Falls, Mental disability, Obesity, Seizures (HCC), Sleep apnea, Sleep apnea with use of continuous positive airway pressure (CPAP) (02/01/2013), and Vision abnormalities. here with  OSA on CPAP   CPAP compliance excellent Residual AHI is good     Seizures   Continue Lyrica 75 mg daily Continue carbamazepine 200 mg twice a day Order for blood work placed.   F/U in 1 year or sooner if needed   Butch Penny, MSN, NP-C 05/07/2023, 12:56 PM [facility] [address], [address] [place], [place] [address] [phone]

## 2023-05-18 ENCOUNTER — Other Ambulatory Visit: Payer: Self-pay | Admitting: Adult Health

## 2023-05-20 ENCOUNTER — Telehealth: Payer: Self-pay | Admitting: *Deleted

## 2023-05-20 DIAGNOSIS — R569 Unspecified convulsions: Secondary | ICD-10-CM

## 2023-05-20 DIAGNOSIS — Z5181 Encounter for therapeutic drug level monitoring: Secondary | ICD-10-CM

## 2023-05-20 LAB — COMPREHENSIVE METABOLIC PANEL
ALT: 22 [IU]/L (ref 0–44)
AST: 19 [IU]/L (ref 0–40)
Albumin: 4.8 g/dL (ref 4.1–5.1)
Alkaline Phosphatase: 76 [IU]/L (ref 44–121)
BUN/Creatinine Ratio: 13 (ref 9–20)
BUN: 13 mg/dL (ref 6–24)
Bilirubin Total: <0.2 mg/dL (ref 0.0–1.2)
CO2: 26 mmol/L (ref 20–29)
Calcium: 10 mg/dL (ref 8.7–10.2)
Chloride: 99 mmol/L (ref 96–106)
Creatinine, Ser: 1.03 mg/dL (ref 0.76–1.27)
Globulin, Total: 2.9 g/dL (ref 1.5–4.5)
Glucose: 94 mg/dL (ref 70–99)
Potassium: 5 mmol/L (ref 3.5–5.2)
Sodium: 140 mmol/L (ref 134–144)
Total Protein: 7.7 g/dL (ref 6.0–8.5)
eGFR: 91 mL/min/{1.73_m2} (ref >59)

## 2023-05-20 LAB — CBC WITH DIFFERENTIAL/PLATELET
Basophils Absolute: 0 10*3/uL (ref 0.0–0.2)
Basos: 0 %
EOS (ABSOLUTE): 0.1 10*3/uL (ref 0.0–0.4)
Eos: 2 %
Hematocrit: 41.2 % (ref 37.5–51.0)
Hemoglobin: 13.1 g/dL (ref 13.0–17.7)
Immature Grans (Abs): 0 10*3/uL (ref 0.0–0.1)
Immature Granulocytes: 0 %
Lymphocytes Absolute: 1.6 10*3/uL (ref 0.7–3.1)
Lymphs: 35 %
MCH: 22.5 pg — ABNORMAL LOW (ref 26.6–33.0)
MCHC: 31.8 g/dL (ref 31.5–35.7)
MCV: 71 fL — ABNORMAL LOW (ref 79–97)
Monocytes Absolute: 0.5 10*3/uL (ref 0.1–0.9)
Monocytes: 12 %
Neutrophils Absolute: 2.4 10*3/uL (ref 1.4–7.0)
Neutrophils: 51 %
Platelets: 143 10*3/uL — ABNORMAL LOW (ref 150–450)
RBC: 5.82 x10E6/uL — ABNORMAL HIGH (ref 4.14–5.80)
RDW: 17.2 % — ABNORMAL HIGH (ref 11.6–15.4)
WBC: 4.6 10*3/uL (ref 3.4–10.8)

## 2023-05-20 LAB — CARBAMAZEPINE LEVEL, TOTAL: Carbamazepine (Tegretol), S: 14.5 ug/mL (ref 4.0–12.0)

## 2023-05-20 NOTE — Telephone Encounter (Signed)
I sent the CBC to pt's hematologist Dr Allyne Gee.

## 2023-05-20 NOTE — Telephone Encounter (Signed)
-----   Message from [facility] sent at 05/20/2023  1:57 PM EST ----- Carbamazepine was elevated.  He needs to come back in and recheck blood work-trough level and repeat CBC

## 2023-05-20 NOTE — Telephone Encounter (Signed)
I called Adam back and told him that we won't reorder CBC but I did send the results to Dr Allyne Gee. He was appreciative.

## 2023-05-20 NOTE — Telephone Encounter (Signed)
I spoke with pt's caregiver Adam (on DPR) and discussed lab results and orders per [facility] NP. Adam said he would bring the patient tomorrow morning at 8 am and will bring pt's carbamazepine with him to take AM dose right after lab is drawn. He understands the reason for the trough level.  I told him we would also redraw CBC. He did mention that patient already has a hematologist because he has a blood condition. I told him I would run this by [facility] NP.

## 2023-05-21 ENCOUNTER — Other Ambulatory Visit (INDEPENDENT_AMBULATORY_CARE_PROVIDER_SITE_OTHER): Payer: Self-pay

## 2023-05-21 DIAGNOSIS — Z5181 Encounter for therapeutic drug level monitoring: Secondary | ICD-10-CM

## 2023-05-21 DIAGNOSIS — Z0289 Encounter for other administrative examinations: Secondary | ICD-10-CM

## 2023-05-21 DIAGNOSIS — R569 Unspecified convulsions: Secondary | ICD-10-CM

## 2023-05-22 LAB — CARBAMAZEPINE LEVEL, TOTAL: Carbamazepine (Tegretol), S: 14.2 ug/mL (ref 4.0–12.0)

## 2023-05-27 ENCOUNTER — Telehealth: Payer: Self-pay | Admitting: *Deleted

## 2023-05-27 DIAGNOSIS — R569 Unspecified convulsions: Secondary | ICD-10-CM

## 2023-05-27 DIAGNOSIS — Z79899 Other long term (current) drug therapy: Secondary | ICD-10-CM

## 2023-05-27 DIAGNOSIS — Z9189 Other specified personal risk factors, not elsewhere classified: Secondary | ICD-10-CM

## 2023-05-27 DIAGNOSIS — Z13820 Encounter for screening for osteoporosis: Secondary | ICD-10-CM

## 2023-05-27 NOTE — Telephone Encounter (Signed)
-----   Message from [facility] sent at 05/26/2023  5:18 PM EST ----- Carbamazepine is still elevated. Discussed with Dr. Vickey Huger. We will just monitor for now. He will need a dexa scan for bone density since he is on carbamazepine. If amendable we can order

## 2023-05-27 NOTE — Telephone Encounter (Signed)
I called and did not answer. I did not LM for caregivers of pt regarding lab results. Will call back later.

## 2023-06-03 ENCOUNTER — Encounter: Payer: Self-pay | Admitting: Adult Health

## 2023-06-03 NOTE — Telephone Encounter (Signed)
Error

## 2023-06-03 NOTE — Telephone Encounter (Signed)
 Let patient's caregiver know that I have tried to order DEXA scan but its triggering ABN no matter what code I put in. I can order put they would have to sign a form stating that they would be responsible for the cost if insurance doesn't cover. They could ask PCP they may be able to get it covered? I am happy to order just let them know

## 2023-06-03 NOTE — Addendum Note (Signed)
 Addended by: Hermenia Fiscal S on: 06/03/2023 02:00 PM   Modules accepted: Orders

## 2023-06-03 NOTE — Addendum Note (Signed)
 Addended by: Guy Begin on: 06/03/2023 02:51 PM   Modules accepted: Orders

## 2023-06-03 NOTE — Telephone Encounter (Signed)
 I called and LMVM for pts wife to call back.  Will move forward and order.  They live in [place].

## 2023-06-03 NOTE — Telephone Encounter (Signed)
 Pt's caregiver, Wonda Amis (on DPR) calling to discuss setting up Dexa Scan for bone density.  Would like a call back.

## 2023-06-04 NOTE — Addendum Note (Signed)
 Addended by: Enedina Finner on: 06/04/2023 05:58 AM   Modules accepted: Orders

## 2023-06-04 NOTE — Telephone Encounter (Addendum)
 I called his caregiver.  I relayed that we are having issues with ordering the dexa scan.  It will not take code and pops up ADN (advanced beneficiary notice). They would be responsible for cost if insurance does not cover.  She may want to check with pcp if they can get it to go thru.  She will call insurance and see.  I told her will forward the message to mychart.   Sent message in [facility].

## 2023-10-23 ENCOUNTER — Encounter: Payer: Self-pay | Admitting: Advanced Practice Midwife

## 2023-11-25 ENCOUNTER — Other Ambulatory Visit: Payer: Self-pay | Admitting: Adult Health

## 2023-11-25 DIAGNOSIS — G4733 Obstructive sleep apnea (adult) (pediatric): Secondary | ICD-10-CM

## 2023-11-25 DIAGNOSIS — R569 Unspecified convulsions: Secondary | ICD-10-CM

## 2024-04-26 ENCOUNTER — Other Ambulatory Visit: Payer: Self-pay

## 2024-04-26 ENCOUNTER — Encounter (HOSPITAL_COMMUNITY): Payer: Self-pay | Admitting: Emergency Medicine

## 2024-04-26 ENCOUNTER — Emergency Department (HOSPITAL_COMMUNITY)
Admission: EM | Admit: 2024-04-26 | Discharge: 2024-04-26 | Disposition: A | Discharge status: Home / Self Care | Attending: Emergency Medicine | Admitting: Emergency Medicine

## 2024-04-26 DIAGNOSIS — R451 Restlessness and agitation: Secondary | ICD-10-CM | POA: Diagnosis present

## 2024-04-26 DIAGNOSIS — Z7901 Long term (current) use of anticoagulants: Secondary | ICD-10-CM | POA: Insufficient documentation

## 2024-04-26 LAB — BASIC METABOLIC PANEL WITH GFR
Anion gap: 9 (ref 5–15)
BUN: 13 mg/dL (ref 6–20)
CO2: 28 mmol/L (ref 22–32)
Calcium: 9.2 mg/dL (ref 8.9–10.3)
Chloride: 104 mmol/L (ref 98–111)
Creatinine, Ser: 1.06 mg/dL (ref 0.61–1.24)
GFR, Estimated: >60 mL/min
Glucose, Bld: 97 mg/dL (ref 70–99)
Potassium: 3.9 mmol/L (ref 3.5–5.1)
Sodium: 141 mmol/L (ref 135–145)

## 2024-04-26 LAB — URINALYSIS, ROUTINE W REFLEX MICROSCOPIC
Bilirubin Urine: NEGATIVE
Glucose, UA: NEGATIVE mg/dL
Hgb urine dipstick: NEGATIVE
Ketones, ur: NEGATIVE mg/dL
Leukocytes,Ua: NEGATIVE
Nitrite: NEGATIVE
Protein, ur: NEGATIVE mg/dL
Specific Gravity, Urine: 1.016 (ref 1.005–1.030)
pH: 7 (ref 5.0–8.0)

## 2024-04-26 LAB — CBC WITH DIFFERENTIAL/PLATELET
Abs Immature Granulocytes: 0.03 K/uL (ref 0.00–0.07)
Basophils Absolute: 0 K/uL (ref 0.0–0.1)
Basophils Relative: 0 %
Eosinophils Absolute: 0 K/uL (ref 0.0–0.5)
Eosinophils Relative: 0 %
HCT: 35.6 % — ABNORMAL LOW (ref 39.0–52.0)
Hemoglobin: 11.9 g/dL — ABNORMAL LOW (ref 13.0–17.0)
Immature Granulocytes: 0 %
Lymphocytes Relative: 13 %
Lymphs Abs: 1.1 K/uL (ref 0.7–4.0)
MCH: 22.7 pg — ABNORMAL LOW (ref 26.0–34.0)
MCHC: 33.4 g/dL (ref 30.0–36.0)
MCV: 67.8 fL — ABNORMAL LOW (ref 80.0–100.0)
Monocytes Absolute: 0.8 K/uL (ref 0.1–1.0)
Monocytes Relative: 9 %
Neutro Abs: 6.7 K/uL (ref 1.7–7.7)
Neutrophils Relative %: 78 %
Platelets: 121 K/uL — ABNORMAL LOW (ref 150–400)
RBC: 5.25 MIL/uL (ref 4.22–5.81)
RDW: 15.8 % — ABNORMAL HIGH (ref 11.5–15.5)
Smear Review: NORMAL
WBC: 8.6 K/uL (ref 4.0–10.5)
nRBC: 0 % (ref 0.0–0.2)

## 2024-04-26 NOTE — ED Notes (Signed)
 Attempt made to contact the legal guardian.

## 2024-04-26 NOTE — Discharge Instructions (Signed)
 Your blood test today were reassuring.  Your urinalysis did not show any signs of infection.  Continue your current medications and follow-up with your primary care and mental health doctor

## 2024-04-26 NOTE — ED Notes (Signed)
 Patient attempted to provide a urine sample, but spilled the urine.

## 2024-04-26 NOTE — ED Notes (Signed)
 Ostomy bag replaced

## 2024-04-26 NOTE — ED Triage Notes (Addendum)
 Patient was asked to clean up at a day program and became agitated. Wanting to leave he tripped over a trash can and then became more agitated. He began to strip of his clothes, spit and throw objects. During this time, he also ripped off his colostomy bag. Patient did not hit his head nor did he lose consciousness. Patient is from a group home.    HX: IDD, blind, CP   EMS vitals: 102 HR 98% SPO2 on room air 154/98 BP 115 CBG

## 2024-04-26 NOTE — ED Provider Notes (Signed)
 " [place] EMERGENCY DEPARTMENT AT [facility] Provider Note   CSN: [id number] Arrival date & time: 04/26/24  1405     Patient presents with: Agitation   Mario Willis is a 49 y.o. male.   HPI Patient was asked to clean up at a day program and became agitated. Wanting to leave he tripped over a trash can and then became more agitated. He began to strip of his clothes, spit and throw objects. During this time, he also ripped off his colostomy bag. Patient did not hit his head nor did he lose consciousness. Patient is from a group home.      HX: IDD, blind, CP     EMS vitals: 102 HR 98% SPO2 on room air 154/98 BP 115 CBG   The patient does not complain of any new me, states that he is ready to go home.  After I discussed the patient with nurse, EMS, I discussed his presentation with his guardian.  She notes that this is unusual behavior, it has been a long time since he had an outburst.  She voices concern about possible urinary tract infection, urinalysis will be collected.    Prior to Admission medications  Medication Sig Start Date End Date Taking? Authorizing Provider  APIXABAN WINN) VTE STARTER PACK (10MG  AND 5MG ) Take by mouth. 11/16/19   [provider]  Asenapine Maleate 2.5 MG SUBL Place 5 mg under the tongue as needed.    [provider]  benztropine (COGENTIN) 0.5 MG tablet Take 0.5 mg by mouth 2 (two) times daily.    [provider]  carbamazepine  (TEGRETOL ) 100 MG chewable tablet Chew 2 tablets (200 mg total) by mouth 2 (two) times daily. Patient taking differently: Chew 200 mg by mouth 2 (two) times daily. 1 tablet 2x daily 04/30/17   Dohmeier, Dedra, MD  cetirizine (ZYRTEC) 10 MG tablet Take 10 mg by mouth. 08/28/14 08/28/15  [provider]  cetirizine (ZYRTEC) 10 MG tablet Take 10 mg by mouth daily.    [provider]  clonazePAM (KLONOPIN) 1 MG tablet Take 0.5 mg by mouth 3 (three) times daily as  needed for anxiety.    [provider]  desmopressin (DDAVP) 0.2 MG tablet Take 0.2 mg by mouth daily. At night for insipidus.    [provider]  diltiazem  (DILACOR XR ) 240 MG 24 hr capsule Take 1 capsule (240 mg total) by mouth daily. Patient taking differently: Take 360 mg by mouth daily. 02/01/13   Dohmeier, Dedra, MD  diltiazem  (TIAZAC ) 360 MG 24 hr capsule 360 mg daily. 8pm 09/09/17   [provider]  docusate sodium (COLACE) 100 MG capsule 60 mg 2 (two) times daily. 1 pill 2 x a day 08/16/13   [provider]  ELIQUIS 5 MG TABS tablet Take 5 mg by mouth 2 (two) times daily.    [provider]  ferrous sulfate 325 (65 FE) MG tablet Take 325 mg by mouth daily with breakfast. 1 tablet 2x daily    [provider]  FLUoxetine (PROZAC) 20 MG capsule Take 40 mg by mouth at bedtime.    [provider]  IRON PO Take by mouth daily.    [provider]  montelukast (SINGULAIR) 10 MG tablet Take 10 mg by mouth every morning. 1 pill in the am 08/16/13   [provider]  polyethylene glycol (MIRALAX / GLYCOLAX) packet Take 17 g by mouth daily as needed.    [provider]  pregabalin  (LYRICA ) 75 MG capsule TAKE ONE CAPSULE BY MOUTH DAILY 11/25/23   Millikan, Megan, NP  QUEtiapine (SEROQUEL XR) 400 MG 24 hr tablet Take 800 mg by mouth at bedtime. 8 pm    [provider]  QUEtiapine (SEROQUEL) 300 MG tablet Take 300 mg by mouth at bedtime. 2 at 8 Pm po.    [provider]    Allergies: Gabapentin    Review of Systems  Updated Vital Signs BP (!) 149/89   Pulse 87   Temp 99.1 F (37.3 C) (Oral)   Resp 18   SpO2 94%   Physical Exam Vitals and nursing note reviewed.  Constitutional:      General: He is not in acute distress.    Appearance: He is well-developed.  HENT:     Head: Normocephalic and atraumatic.   Eyes:     Conjunctiva/sclera: Conjunctivae normal.  Cardiovascular:     Rate  and Rhythm: Normal rate and regular rhythm.  Pulmonary:     Effort: Pulmonary effort is normal. No respiratory distress.     Breath sounds: No stridor.  Abdominal:     General: There is no distension.  Skin:    General: Skin is warm and dry.  Neurological:     Mental Status: He is alert.     Comments: Patient with atrophy cognitive impairment, but no facial asymmetry, speech is brief, but clear, he follows commands appropriately.  Psychiatric:        Cognition and Memory: Cognition is impaired. Memory is impaired.     (all labs ordered are listed, but only abnormal results are displayed) Labs Reviewed - No data to display  EKG: None  Radiology: No results found.   Procedures   Medications Ordered in the ED - No data to display                                  Medical Decision Making Cognitively impaired adult male arrives from day program after an episode of agitation.  Patient is hemodynamically remarkable, awake, alert, denies complaints, though his cognitive impairment is limiting. Broad differential including agitation secondary to being asked to do something he does not want to do versus colostomy irritation which he is noted to have.  Patient's vital signs, physical exam reassuring, with consideration of occult infection, urinalysis sent.  Chemistry sent.  Amount and/or Complexity of Data Reviewed Independent Historian: guardian and EMS External Data Reviewed: notes. Labs: ordered. Decision-making details documented in ED Course.  Risk Decision regarding hospitalization. Diagnosis or treatment significantly limited by social determinants of health.  Pulse ox 100% room air normal   Chemistry, CBC both unremarkable.  Urinalysis pending on signout.  Final diagnoses:  Agitation     Garrick Charleston, MD 04/26/24 1712  "

## 2024-04-26 NOTE — ED Provider Notes (Signed)
 Patient was initially seen by Dr. Garrick.  Please see his note.  Patient came to the ED for evaluation because he was agitated at the group home.  ED workup reassuring.  No signs of acute infection.  Laboratory test unremarkable.  Patient has remained calm in the ED.  Evaluation and diagnostic testing in the emergency department does not suggest an emergent condition requiring admission or immediate intervention beyond what has been performed at this time.  The patient is safe for discharge and has been instructed to return immediately for worsening symptoms, change in symptoms or any other concerns.   Randol Simmonds, MD 04/26/24 [phone]

## 2024-05-17 ENCOUNTER — Telehealth: Payer: Medicare Other | Admitting: Adult Health
